# Patient Record
Sex: Female | Born: 1955 | Hispanic: No | Marital: Married | Smoking: Former smoker
Health system: Southern US, Community
[De-identification: ages and names within clinical notes are randomized; demographics above are authoritative.]

---

## 2008-09-29 ENCOUNTER — Ambulatory Visit: Admission: RE | Admit: 2008-09-29 | Discharge: 2008-12-24 | Payer: Self-pay | Admitting: Radiation Oncology

## 2008-09-30 ENCOUNTER — Ambulatory Visit: Payer: Self-pay | Admitting: Oncology

## 2008-10-01 LAB — COMPREHENSIVE METABOLIC PANEL
BUN: 14 mg/dL (ref 6–23)
CO2: 25 mEq/L (ref 19–32)
Calcium: 9.6 mg/dL (ref 8.4–10.5)
Chloride: 105 mEq/L (ref 96–112)
Creatinine, Ser: 0.67 mg/dL (ref 0.40–1.20)

## 2008-10-01 LAB — CBC WITH DIFFERENTIAL/PLATELET
Basophils Absolute: 0.1 10*3/uL (ref 0.0–0.1)
EOS%: 1.8 % (ref 0.0–7.0)
HCT: 31.5 % — ABNORMAL LOW (ref 34.8–46.6)
HGB: 10.4 g/dL — ABNORMAL LOW (ref 11.6–15.9)
LYMPH%: 18.7 % (ref 14.0–48.0)
MCH: 28.8 pg (ref 26.0–34.0)
MONO#: 0.6 10*3/uL (ref 0.1–0.9)
NEUT%: 73.5 % (ref 39.6–76.8)
Platelets: 518 10*3/uL — ABNORMAL HIGH (ref 145–400)
lymph#: 2.2 10*3/uL (ref 0.9–3.3)

## 2008-10-15 LAB — CBC WITH DIFFERENTIAL/PLATELET
BASO%: 0.5 % (ref 0.0–2.0)
Basophils Absolute: 0.1 10*3/uL (ref 0.0–0.1)
Eosinophils Absolute: 0.2 10*3/uL (ref 0.0–0.5)
HCT: 34.9 % (ref 34.8–46.6)
HGB: 11.5 g/dL — ABNORMAL LOW (ref 11.6–15.9)
MONO#: 0.4 10*3/uL (ref 0.1–0.9)
NEUT#: 9.7 10*3/uL — ABNORMAL HIGH (ref 1.5–6.5)
NEUT%: 75.3 % (ref 39.6–76.8)
WBC: 12.9 10*3/uL — ABNORMAL HIGH (ref 3.9–10.0)
lymph#: 2.5 10*3/uL (ref 0.9–3.3)

## 2008-10-15 LAB — COMPREHENSIVE METABOLIC PANEL
ALT: 12 U/L (ref 0–35)
AST: 14 U/L (ref 0–37)
CO2: 25 mEq/L (ref 19–32)
Sodium: 138 mEq/L (ref 135–145)
Total Bilirubin: 0.2 mg/dL — ABNORMAL LOW (ref 0.3–1.2)
Total Protein: 7.6 g/dL (ref 6.0–8.3)

## 2008-11-03 LAB — CBC WITH DIFFERENTIAL/PLATELET
BASO%: 0.4 % (ref 0.0–2.0)
Eosinophils Absolute: 0.2 10*3/uL (ref 0.0–0.5)
LYMPH%: 21 % (ref 14.0–48.0)
MCHC: 32.9 g/dL (ref 32.0–36.0)
MONO#: 0.6 10*3/uL (ref 0.1–0.9)
NEUT#: 8.4 10*3/uL — ABNORMAL HIGH (ref 1.5–6.5)
Platelets: 354 10*3/uL (ref 145–400)
RBC: 4.21 10*6/uL (ref 3.70–5.32)
WBC: 11.7 10*3/uL — ABNORMAL HIGH (ref 3.9–10.0)
lymph#: 2.5 10*3/uL (ref 0.9–3.3)

## 2008-11-10 LAB — COMPREHENSIVE METABOLIC PANEL
ALT: 12 U/L (ref 0–35)
BUN: 18 mg/dL (ref 6–23)
CO2: 24 mEq/L (ref 19–32)
Calcium: 9.4 mg/dL (ref 8.4–10.5)
Chloride: 96 mEq/L (ref 96–112)
Creatinine, Ser: 1.21 mg/dL — ABNORMAL HIGH (ref 0.40–1.20)
Glucose, Bld: 94 mg/dL (ref 70–99)
Total Bilirubin: 0.2 mg/dL — ABNORMAL LOW (ref 0.3–1.2)

## 2008-11-10 LAB — CBC WITH DIFFERENTIAL/PLATELET
BASO%: 0.5 % (ref 0.0–2.0)
Basophils Absolute: 0 10*3/uL (ref 0.0–0.1)
Eosinophils Absolute: 0 10*3/uL (ref 0.0–0.5)
HCT: 35.3 % (ref 34.8–46.6)
HGB: 11.6 g/dL (ref 11.6–15.9)
LYMPH%: 20.4 % (ref 14.0–49.7)
MONO#: 0.5 10*3/uL (ref 0.1–0.9)
NEUT#: 6.2 10*3/uL (ref 1.5–6.5)
NEUT%: 72.3 % (ref 38.4–76.8)
Platelets: 371 10*3/uL (ref 145–400)
WBC: 8.6 10*3/uL (ref 3.9–10.3)
lymph#: 1.8 10*3/uL (ref 0.9–3.3)

## 2008-11-13 LAB — BASIC METABOLIC PANEL
CO2: 22 mEq/L (ref 19–32)
Calcium: 8.4 mg/dL (ref 8.4–10.5)
Potassium: 4.1 mEq/L (ref 3.5–5.3)
Sodium: 140 mEq/L (ref 135–145)

## 2008-11-17 ENCOUNTER — Ambulatory Visit: Payer: Self-pay | Admitting: Oncology

## 2008-11-17 LAB — COMPREHENSIVE METABOLIC PANEL
ALT: <8 U/L (ref 0–35)
Alkaline Phosphatase: 79 U/L (ref 39–117)
CO2: 19 mEq/L (ref 19–32)
Sodium: 141 mEq/L (ref 135–145)
Total Bilirubin: 0.3 mg/dL (ref 0.3–1.2)
Total Protein: 7.7 g/dL (ref 6.0–8.3)

## 2008-11-17 LAB — CBC WITH DIFFERENTIAL/PLATELET
BASO%: 0.3 % (ref 0.0–2.0)
LYMPH%: 9.8 % — ABNORMAL LOW (ref 14.0–49.7)
MCHC: 32.7 g/dL (ref 31.5–36.0)
MONO#: 0.4 10*3/uL (ref 0.1–0.9)
Platelets: 296 10*3/uL (ref 145–400)
RBC: 4.21 10*6/uL (ref 3.70–5.45)
RDW: 16.6 % — ABNORMAL HIGH (ref 11.2–14.5)
WBC: 8.3 10*3/uL (ref 3.9–10.3)

## 2008-11-24 LAB — COMPREHENSIVE METABOLIC PANEL
Alkaline Phosphatase: 94 U/L (ref 39–117)
BUN: 27 mg/dL — ABNORMAL HIGH (ref 6–23)
Glucose, Bld: 110 mg/dL — ABNORMAL HIGH (ref 70–99)
Sodium: 136 mEq/L (ref 135–145)
Total Bilirubin: 0.3 mg/dL (ref 0.3–1.2)

## 2008-11-24 LAB — CBC WITH DIFFERENTIAL/PLATELET
Basophils Absolute: 0 10*3/uL (ref 0.0–0.1)
Eosinophils Absolute: 0.1 10*3/uL (ref 0.0–0.5)
HCT: 38 % (ref 34.8–46.6)
HGB: 12.2 g/dL (ref 11.6–15.9)
LYMPH%: 14.7 % (ref 14.0–49.7)
MCV: 84.3 fL (ref 79.5–101.0)
MONO%: 8.2 % (ref 0.0–14.0)
NEUT#: 4.1 10*3/uL (ref 1.5–6.5)
Platelets: 435 10*3/uL — ABNORMAL HIGH (ref 145–400)

## 2008-12-01 LAB — CBC WITH DIFFERENTIAL/PLATELET
BASO%: 0.1 % (ref 0.0–2.0)
HCT: 36.8 % (ref 34.8–46.6)
LYMPH%: 9.4 % — ABNORMAL LOW (ref 14.0–49.7)
MCH: 28.1 pg (ref 25.1–34.0)
MCHC: 33.4 g/dL (ref 31.5–36.0)
MONO#: 0.3 10*3/uL (ref 0.1–0.9)
NEUT%: 86.3 % — ABNORMAL HIGH (ref 38.4–76.8)
Platelets: 305 10*3/uL (ref 145–400)
WBC: 7.9 10*3/uL (ref 3.9–10.3)

## 2008-12-01 LAB — BASIC METABOLIC PANEL
BUN: 35 mg/dL — ABNORMAL HIGH (ref 6–23)
CO2: 25 mEq/L (ref 19–32)
Calcium: 9.9 mg/dL (ref 8.4–10.5)
Creatinine, Ser: 0.92 mg/dL (ref 0.40–1.20)

## 2008-12-02 ENCOUNTER — Ambulatory Visit (HOSPITAL_COMMUNITY): Admission: RE | Admit: 2008-12-02 | Discharge: 2008-12-02 | Payer: Self-pay | Admitting: Radiation Oncology

## 2008-12-08 LAB — CBC WITH DIFFERENTIAL/PLATELET
BASO%: 0.5 % (ref 0.0–2.0)
Basophils Absolute: 0 10*3/uL (ref 0.0–0.1)
EOS%: 0.5 % (ref 0.0–7.0)
HGB: 11.4 g/dL — ABNORMAL LOW (ref 11.6–15.9)
MCH: 26.9 pg (ref 25.1–34.0)
MCHC: 31.8 g/dL (ref 31.5–36.0)
RBC: 4.24 10*6/uL (ref 3.70–5.45)
RDW: 15.8 % — ABNORMAL HIGH (ref 11.2–14.5)
lymph#: 0.6 10*3/uL — ABNORMAL LOW (ref 0.9–3.3)
nRBC: 0 % (ref 0–0)

## 2008-12-08 LAB — COMPREHENSIVE METABOLIC PANEL
ALT: 17 U/L (ref 0–35)
AST: 24 U/L (ref 0–37)
Albumin: 3.7 g/dL (ref 3.5–5.2)
Alkaline Phosphatase: 109 U/L (ref 39–117)
Potassium: 4.7 mEq/L (ref 3.5–5.3)
Sodium: 136 mEq/L (ref 135–145)
Total Bilirubin: 0.5 mg/dL (ref 0.3–1.2)
Total Protein: 7.4 g/dL (ref 6.0–8.3)

## 2008-12-16 LAB — CBC WITH DIFFERENTIAL/PLATELET
EOS%: 0.4 % (ref 0.0–7.0)
LYMPH%: 7.9 % — ABNORMAL LOW (ref 14.0–49.7)
MCH: 28.3 pg (ref 25.1–34.0)
MCV: 83.6 fL (ref 79.5–101.0)
MONO%: 7 % (ref 0.0–14.0)
Platelets: 367 10*3/uL (ref 145–400)
RBC: 3.74 10*6/uL (ref 3.70–5.45)
RDW: 17 % — ABNORMAL HIGH (ref 11.2–14.5)

## 2008-12-16 LAB — BASIC METABOLIC PANEL
BUN: 26 mg/dL — ABNORMAL HIGH (ref 6–23)
Calcium: 9.2 mg/dL (ref 8.4–10.5)
Glucose, Bld: 123 mg/dL — ABNORMAL HIGH (ref 70–99)
Potassium: 4.8 mEq/L (ref 3.5–5.3)
Sodium: 132 mEq/L — ABNORMAL LOW (ref 135–145)

## 2009-01-13 ENCOUNTER — Ambulatory Visit: Payer: Self-pay | Admitting: Oncology

## 2009-01-15 LAB — COMPREHENSIVE METABOLIC PANEL
ALT: <8 U/L (ref 0–35)
Albumin: 4.2 g/dL (ref 3.5–5.2)
CO2: 24 mEq/L (ref 19–32)
Calcium: 9.3 mg/dL (ref 8.4–10.5)
Chloride: 101 mEq/L (ref 96–112)
Potassium: 4.7 mEq/L (ref 3.5–5.3)
Sodium: 145 mEq/L (ref 135–145)
Total Bilirubin: 0.2 mg/dL — ABNORMAL LOW (ref 0.3–1.2)
Total Protein: 7.2 g/dL (ref 6.0–8.3)

## 2009-01-15 LAB — CBC WITH DIFFERENTIAL/PLATELET
BASO%: 0.4 % (ref 0.0–2.0)
Eosinophils Absolute: 0.1 10*3/uL (ref 0.0–0.5)
MCHC: 32.3 g/dL (ref 31.5–36.0)
MONO#: 0.5 10*3/uL (ref 0.1–0.9)
NEUT#: 4 10*3/uL (ref 1.5–6.5)
RBC: 3.75 10*6/uL (ref 3.70–5.45)
WBC: 5.4 10*3/uL (ref 3.9–10.3)
lymph#: 0.8 10*3/uL — ABNORMAL LOW (ref 0.9–3.3)

## 2009-03-11 ENCOUNTER — Ambulatory Visit: Payer: Self-pay | Admitting: Oncology

## 2009-03-13 LAB — COMPREHENSIVE METABOLIC PANEL
ALT: <8 U/L (ref 0–35)
CO2: 24 mEq/L (ref 19–32)
Calcium: 9.3 mg/dL (ref 8.4–10.5)
Chloride: 107 mEq/L (ref 96–112)
Creatinine, Ser: 0.84 mg/dL (ref 0.40–1.20)
Glucose, Bld: 135 mg/dL — ABNORMAL HIGH (ref 70–99)
Sodium: 142 mEq/L (ref 135–145)
Total Protein: 7.2 g/dL (ref 6.0–8.3)

## 2009-03-13 LAB — CBC WITH DIFFERENTIAL/PLATELET
BASO%: 0.4 % (ref 0.0–2.0)
Eosinophils Absolute: 0.1 10*3/uL (ref 0.0–0.5)
HCT: 29 % — ABNORMAL LOW (ref 34.8–46.6)
LYMPH%: 12.9 % — ABNORMAL LOW (ref 14.0–49.7)
MCHC: 34.6 g/dL (ref 31.5–36.0)
MONO#: 0.3 10*3/uL (ref 0.1–0.9)
NEUT#: 3.5 10*3/uL (ref 1.5–6.5)
NEUT%: 77.1 % — ABNORMAL HIGH (ref 38.4–76.8)
Platelets: 287 10*3/uL (ref 145–400)
WBC: 4.5 10*3/uL (ref 3.9–10.3)
lymph#: 0.6 10*3/uL — ABNORMAL LOW (ref 0.9–3.3)

## 2009-11-17 ENCOUNTER — Ambulatory Visit: Admission: RE | Admit: 2009-11-17 | Discharge: 2010-01-05 | Payer: Self-pay | Admitting: Radiation Oncology

## 2010-10-10 ENCOUNTER — Encounter: Payer: Self-pay | Admitting: Radiation Oncology

## 2015-08-19 ENCOUNTER — Other Ambulatory Visit: Payer: Self-pay | Admitting: Family

## 2016-02-19 ENCOUNTER — Other Ambulatory Visit: Payer: Self-pay | Admitting: Internal Medicine

## 2016-02-19 DIAGNOSIS — E2839 Other primary ovarian failure: Secondary | ICD-10-CM

## 2016-03-16 ENCOUNTER — Ambulatory Visit
Admission: RE | Admit: 2016-03-16 | Discharge: 2016-03-16 | Disposition: A | Discharge status: Home / Self Care | Payer: Medicare Other | Source: Ambulatory Visit | Attending: Internal Medicine | Admitting: Internal Medicine

## 2016-03-16 DIAGNOSIS — E2839 Other primary ovarian failure: Secondary | ICD-10-CM

## 2018-09-21 ENCOUNTER — Other Ambulatory Visit: Payer: Self-pay | Admitting: Internal Medicine

## 2018-09-21 DIAGNOSIS — E2839 Other primary ovarian failure: Secondary | ICD-10-CM

## 2018-12-04 ENCOUNTER — Other Ambulatory Visit: Payer: Medicare Other

## 2019-02-06 ENCOUNTER — Other Ambulatory Visit: Payer: Medicare Other

## 2019-10-05 ENCOUNTER — Emergency Department (HOSPITAL_COMMUNITY): Payer: Medicare Other

## 2019-10-05 ENCOUNTER — Inpatient Hospital Stay (HOSPITAL_COMMUNITY)
Admission: EM | Admit: 2019-10-05 | Discharge: 2019-10-11 | DRG: 175 | Disposition: A | Discharge status: Hospice | Payer: Medicare Other | Attending: Internal Medicine | Admitting: Internal Medicine

## 2019-10-05 ENCOUNTER — Other Ambulatory Visit: Payer: Self-pay

## 2019-10-05 DIAGNOSIS — G9341 Metabolic encephalopathy: Secondary | ICD-10-CM

## 2019-10-05 DIAGNOSIS — K922 Gastrointestinal hemorrhage, unspecified: Secondary | ICD-10-CM | POA: Diagnosis present

## 2019-10-05 DIAGNOSIS — R599 Enlarged lymph nodes, unspecified: Secondary | ICD-10-CM | POA: Diagnosis present

## 2019-10-05 DIAGNOSIS — C799 Secondary malignant neoplasm of unspecified site: Secondary | ICD-10-CM

## 2019-10-05 DIAGNOSIS — R4781 Slurred speech: Secondary | ICD-10-CM | POA: Diagnosis present

## 2019-10-05 DIAGNOSIS — G92 Toxic encephalopathy: Secondary | ICD-10-CM | POA: Diagnosis present

## 2019-10-05 DIAGNOSIS — D649 Anemia, unspecified: Secondary | ICD-10-CM

## 2019-10-05 DIAGNOSIS — R5381 Other malaise: Secondary | ICD-10-CM | POA: Diagnosis not present

## 2019-10-05 DIAGNOSIS — Z7951 Long term (current) use of inhaled steroids: Secondary | ICD-10-CM

## 2019-10-05 DIAGNOSIS — H905 Unspecified sensorineural hearing loss: Secondary | ICD-10-CM | POA: Diagnosis present

## 2019-10-05 DIAGNOSIS — Z7401 Bed confinement status: Secondary | ICD-10-CM

## 2019-10-05 DIAGNOSIS — I10 Essential (primary) hypertension: Secondary | ICD-10-CM | POA: Diagnosis present

## 2019-10-05 DIAGNOSIS — J439 Emphysema, unspecified: Secondary | ICD-10-CM | POA: Diagnosis present

## 2019-10-05 DIAGNOSIS — D509 Iron deficiency anemia, unspecified: Secondary | ICD-10-CM | POA: Diagnosis present

## 2019-10-05 DIAGNOSIS — R531 Weakness: Secondary | ICD-10-CM | POA: Diagnosis not present

## 2019-10-05 DIAGNOSIS — J449 Chronic obstructive pulmonary disease, unspecified: Secondary | ICD-10-CM | POA: Diagnosis not present

## 2019-10-05 DIAGNOSIS — Z993 Dependence on wheelchair: Secondary | ICD-10-CM

## 2019-10-05 DIAGNOSIS — I2694 Multiple subsegmental pulmonary emboli without acute cor pulmonale: Secondary | ICD-10-CM | POA: Diagnosis present

## 2019-10-05 DIAGNOSIS — C7889 Secondary malignant neoplasm of other digestive organs: Secondary | ICD-10-CM | POA: Diagnosis present

## 2019-10-05 DIAGNOSIS — K639 Disease of intestine, unspecified: Secondary | ICD-10-CM | POA: Diagnosis present

## 2019-10-05 DIAGNOSIS — N179 Acute kidney failure, unspecified: Secondary | ICD-10-CM | POA: Diagnosis present

## 2019-10-05 DIAGNOSIS — J9621 Acute and chronic respiratory failure with hypoxia: Secondary | ICD-10-CM | POA: Diagnosis present

## 2019-10-05 DIAGNOSIS — Z515 Encounter for palliative care: Secondary | ICD-10-CM | POA: Diagnosis not present

## 2019-10-05 DIAGNOSIS — Z79899 Other long term (current) drug therapy: Secondary | ICD-10-CM

## 2019-10-05 DIAGNOSIS — Z7189 Other specified counseling: Secondary | ICD-10-CM | POA: Diagnosis not present

## 2019-10-05 DIAGNOSIS — Z7984 Long term (current) use of oral hypoglycemic drugs: Secondary | ICD-10-CM

## 2019-10-05 DIAGNOSIS — F329 Major depressive disorder, single episode, unspecified: Secondary | ICD-10-CM | POA: Diagnosis present

## 2019-10-05 DIAGNOSIS — E890 Postprocedural hypothyroidism: Secondary | ICD-10-CM | POA: Diagnosis present

## 2019-10-05 DIAGNOSIS — I2699 Other pulmonary embolism without acute cor pulmonale: Secondary | ICD-10-CM

## 2019-10-05 DIAGNOSIS — Z9981 Dependence on supplemental oxygen: Secondary | ICD-10-CM | POA: Diagnosis not present

## 2019-10-05 DIAGNOSIS — E1165 Type 2 diabetes mellitus with hyperglycemia: Secondary | ICD-10-CM | POA: Diagnosis present

## 2019-10-05 DIAGNOSIS — F419 Anxiety disorder, unspecified: Secondary | ICD-10-CM | POA: Diagnosis present

## 2019-10-05 DIAGNOSIS — E872 Acidosis: Secondary | ICD-10-CM | POA: Diagnosis present

## 2019-10-05 DIAGNOSIS — C7970 Secondary malignant neoplasm of unspecified adrenal gland: Secondary | ICD-10-CM | POA: Diagnosis present

## 2019-10-05 DIAGNOSIS — Z20822 Contact with and (suspected) exposure to covid-19: Secondary | ICD-10-CM | POA: Diagnosis present

## 2019-10-05 DIAGNOSIS — Z85850 Personal history of malignant neoplasm of thyroid: Secondary | ICD-10-CM

## 2019-10-05 DIAGNOSIS — Z853 Personal history of malignant neoplasm of breast: Secondary | ICD-10-CM

## 2019-10-05 DIAGNOSIS — Z87891 Personal history of nicotine dependence: Secondary | ICD-10-CM

## 2019-10-05 DIAGNOSIS — Z66 Do not resuscitate: Secondary | ICD-10-CM | POA: Diagnosis not present

## 2019-10-05 DIAGNOSIS — K6389 Other specified diseases of intestine: Secondary | ICD-10-CM | POA: Diagnosis not present

## 2019-10-05 DIAGNOSIS — Z7901 Long term (current) use of anticoagulants: Secondary | ICD-10-CM

## 2019-10-05 DIAGNOSIS — R569 Unspecified convulsions: Secondary | ICD-10-CM | POA: Diagnosis not present

## 2019-10-05 DIAGNOSIS — Z8589 Personal history of malignant neoplasm of other organs and systems: Secondary | ICD-10-CM

## 2019-10-05 DIAGNOSIS — Z79890 Hormone replacement therapy (postmenopausal): Secondary | ICD-10-CM

## 2019-10-05 DIAGNOSIS — Z86711 Personal history of pulmonary embolism: Secondary | ICD-10-CM

## 2019-10-05 DIAGNOSIS — R410 Disorientation, unspecified: Secondary | ICD-10-CM | POA: Diagnosis not present

## 2019-10-05 LAB — RAPID URINE DRUG SCREEN, HOSP PERFORMED
Amphetamines: NOT DETECTED
Barbiturates: NOT DETECTED
Benzodiazepines: POSITIVE — AB
Cocaine: NOT DETECTED
Opiates: NOT DETECTED
Tetrahydrocannabinol: NOT DETECTED

## 2019-10-05 LAB — URINALYSIS, ROUTINE W REFLEX MICROSCOPIC
Bilirubin Urine: NEGATIVE
Glucose, UA: NEGATIVE mg/dL
Hgb urine dipstick: NEGATIVE
Ketones, ur: NEGATIVE mg/dL
Nitrite: NEGATIVE
Protein, ur: 30 mg/dL — AB
Specific Gravity, Urine: 1.021 (ref 1.005–1.030)
pH: 5 (ref 5.0–8.0)

## 2019-10-05 LAB — BASIC METABOLIC PANEL
Anion gap: 14 (ref 5–15)
BUN: 13 mg/dL (ref 8–23)
CO2: 22 mmol/L (ref 22–32)
Calcium: 8.9 mg/dL (ref 8.9–10.3)
Chloride: 108 mmol/L (ref 98–111)
Creatinine, Ser: 1.22 mg/dL — ABNORMAL HIGH (ref 0.44–1.00)
GFR calc Af Amer: 54 mL/min — ABNORMAL LOW (ref >60)
GFR calc non Af Amer: 47 mL/min — ABNORMAL LOW (ref >60)
Glucose, Bld: 130 mg/dL — ABNORMAL HIGH (ref 70–99)
Potassium: 3.8 mmol/L (ref 3.5–5.1)
Sodium: 144 mmol/L (ref 135–145)

## 2019-10-05 LAB — FOLATE: Folate: 36.8 ng/mL (ref >5.9)

## 2019-10-05 LAB — RETICULOCYTES
Immature Retic Fract: 28.9 % — ABNORMAL HIGH (ref 2.3–15.9)
RBC.: 3.51 MIL/uL — ABNORMAL LOW (ref 3.87–5.11)
Retic Count, Absolute: 80 10*3/uL (ref 19.0–186.0)
Retic Ct Pct: 2.3 % (ref 0.4–3.1)

## 2019-10-05 LAB — ABO/RH: ABO/RH(D): O POS

## 2019-10-05 LAB — CBC
HCT: 27.2 % — ABNORMAL LOW (ref 36.0–46.0)
Hemoglobin: 7.1 g/dL — ABNORMAL LOW (ref 12.0–15.0)
MCH: 20.1 pg — ABNORMAL LOW (ref 26.0–34.0)
MCHC: 26.1 g/dL — ABNORMAL LOW (ref 30.0–36.0)
MCV: 76.8 fL — ABNORMAL LOW (ref 80.0–100.0)
Platelets: 493 10*3/uL — ABNORMAL HIGH (ref 150–400)
RBC: 3.54 MIL/uL — ABNORMAL LOW (ref 3.87–5.11)
RDW: 22.9 % — ABNORMAL HIGH (ref 11.5–15.5)
WBC: 14.5 10*3/uL — ABNORMAL HIGH (ref 4.0–10.5)
nRBC: 0 % (ref 0.0–0.2)

## 2019-10-05 LAB — IRON AND TIBC
Iron: 13 ug/dL — ABNORMAL LOW (ref 28–170)
Saturation Ratios: 3 % — ABNORMAL LOW (ref 10.4–31.8)
TIBC: 463 ug/dL — ABNORMAL HIGH (ref 250–450)
UIBC: 450 ug/dL

## 2019-10-05 LAB — D-DIMER, QUANTITATIVE: D-Dimer, Quant: 3.91 ug/mL-FEU — ABNORMAL HIGH (ref 0.00–0.50)

## 2019-10-05 LAB — PROTIME-INR
INR: 1.2 (ref 0.8–1.2)
Prothrombin Time: 15.3 seconds — ABNORMAL HIGH (ref 11.4–15.2)

## 2019-10-05 LAB — BLOOD GAS, ARTERIAL
Acid-base deficit: 0.7 mmol/L (ref 0.0–2.0)
Bicarbonate: 23.3 mmol/L (ref 20.0–28.0)
Drawn by: 33147
FIO2: 100
O2 Content: 15 L/min
O2 Saturation: 99.4 %
Patient temperature: 98.6
pCO2 arterial: 38.1 mmHg (ref 32.0–48.0)
pH, Arterial: 7.405 (ref 7.350–7.450)
pO2, Arterial: 299 mmHg — ABNORMAL HIGH (ref 83.0–108.0)

## 2019-10-05 LAB — AMMONIA: Ammonia: 40 umol/L — ABNORMAL HIGH (ref 9–35)

## 2019-10-05 LAB — FERRITIN: Ferritin: 7 ng/mL — ABNORMAL LOW (ref 11–307)

## 2019-10-05 LAB — PREPARE RBC (CROSSMATCH)

## 2019-10-05 LAB — POC OCCULT BLOOD, ED: Fecal Occult Bld: POSITIVE — AB

## 2019-10-05 LAB — CBG MONITORING, ED: Glucose-Capillary: 136 mg/dL — ABNORMAL HIGH (ref 70–99)

## 2019-10-05 LAB — TROPONIN I (HIGH SENSITIVITY): Troponin I (High Sensitivity): 3 ng/L (ref <18)

## 2019-10-05 LAB — LACTIC ACID, PLASMA
Lactic Acid, Venous: 4.5 mmol/L (ref 0.5–1.9)
Lactic Acid, Venous: 1.8 mmol/L (ref 0.5–1.9)

## 2019-10-05 LAB — POC SARS CORONAVIRUS 2 AG -  ED: SARS Coronavirus 2 Ag: NEGATIVE

## 2019-10-05 LAB — APTT: aPTT: 30 seconds (ref 24–36)

## 2019-10-05 LAB — VITAMIN B12: Vitamin B-12: 813 pg/mL (ref 180–914)

## 2019-10-05 LAB — BRAIN NATRIURETIC PEPTIDE: B Natriuretic Peptide: 73.9 pg/mL (ref 0.0–100.0)

## 2019-10-05 MED ORDER — SODIUM CHLORIDE 0.9 % IV SOLN: 1.0000 g | Freq: Once | INTRAVENOUS | Status: DC

## 2019-10-05 MED ORDER — VANCOMYCIN HCL IN DEXTROSE 1-5 GM/200ML-% IV SOLN
1000.0000 mg | Freq: Once | INTRAVENOUS | Status: AC
  Administered 2019-10-05: 1000 mg via INTRAVENOUS
  Filled 2019-10-05: qty 200

## 2019-10-05 MED ORDER — SODIUM CHLORIDE 0.9 % IV BOLUS
1000.0000 mL | Freq: Once | INTRAVENOUS | Status: AC
  Administered 2019-10-05: 1000 mL via INTRAVENOUS

## 2019-10-05 MED ORDER — ONDANSETRON HCL 4 MG/2ML IJ SOLN
INTRAMUSCULAR | Status: AC
  Filled 2019-10-05: qty 2

## 2019-10-05 MED ORDER — IOHEXOL 350 MG/ML SOLN
100.0000 mL | Freq: Once | INTRAVENOUS | Status: AC | PRN
  Administered 2019-10-05: 100 mL via INTRAVENOUS

## 2019-10-05 MED ORDER — ONDANSETRON HCL 4 MG/2ML IJ SOLN
4.0000 mg | Freq: Once | INTRAMUSCULAR | Status: AC
  Administered 2019-10-05: 4 mg via INTRAVENOUS

## 2019-10-05 MED ORDER — SODIUM CHLORIDE 0.9 % IV SOLN
2.0000 g | Freq: Once | INTRAVENOUS | Status: AC
  Administered 2019-10-05: 2 g via INTRAVENOUS
  Filled 2019-10-05: qty 2

## 2019-10-05 MED ORDER — SODIUM CHLORIDE 0.9% FLUSH: 10.0000 mL | INTRAVENOUS | Status: DC | PRN

## 2019-10-05 MED ORDER — SODIUM CHLORIDE (PF) 0.9 % IJ SOLN
INTRAMUSCULAR | Status: AC
  Filled 2019-10-05: qty 50

## 2019-10-05 MED ORDER — PANTOPRAZOLE SODIUM 40 MG IV SOLR
40.0000 mg | Freq: Two times a day (BID) | INTRAVENOUS | Status: DC
  Administered 2019-10-05 – 2019-10-10 (×9): 40 mg via INTRAVENOUS
  Filled 2019-10-05 (×9): qty 40

## 2019-10-05 MED ORDER — SODIUM CHLORIDE 0.9 % IV SOLN: 10.0000 mL/h | Freq: Once | INTRAVENOUS | Status: DC

## 2019-10-05 MED ORDER — SODIUM CHLORIDE 0.9% FLUSH
10.0000 mL | Freq: Two times a day (BID) | INTRAVENOUS | Status: DC
  Administered 2019-10-06 – 2019-10-10 (×5): 10 mL

## 2019-10-05 MED ORDER — HEPARIN (PORCINE) 25000 UT/250ML-% IV SOLN
1300.0000 [IU]/h | INTRAVENOUS | Status: DC
  Administered 2019-10-05: 1200 [IU]/h via INTRAVENOUS
  Administered 2019-10-06 – 2019-10-09 (×3): 1300 [IU]/h via INTRAVENOUS
  Filled 2019-10-05 (×5): qty 250

## 2019-10-05 NOTE — ED Provider Notes (Signed)
[facility] DEPT Provider Note   CSN: [id number] Arrival date & time: 10/05/19  1351     History Chief Complaint  Patient presents with  . Cancer Patient  . Shortness of Breath    Loretta Brooks is a 64 y.o. female.  HPI   Patient arrived to the emergency room via EMS.  According to her husband who I spoke to directly, patient has not been acting normally the last couple of days.  She has been not making a lot of sense.  She has been confused.  She also has been weak and not eating or drinking as well.  The EMS reports that when they went to check on her today her oxygen saturation was very low.  Husband has not noticed the patient to be coughing.  She has not had any fevers.  No vomiting or diarrhea.  Patient does have a history of breast CA.  She also does have history of hearing loss.  Husband states that hearing loss is a new issue. Past medical history: Hearing loss, breast cancer Surgical history: Left maxillectomy Patient Active Problem List   Diagnosis Date Noted  . Acute on chronic respiratory failure with hypoxia ([place]) 10/05/2019    History reviewed. No pertinent surgical history.   OB History   No obstetric history on file.     No family history on file.  Social History   Tobacco Use  . Smoking status: Not on file  Substance Use Topics  . Alcohol use: Not on file  . Drug use: Not on file    Home Medications Prior to Admission medications   Not on File    Allergies    Patient has no allergy information on record.  Review of Systems   Review of Systems  All other systems reviewed and are negative.   Physical Exam Updated Vital Signs BP (!) 153/92 (BP Location: Right Arm)   Pulse 94   Temp 98.6 F (37 C) (Oral)   Resp (!) 22   Ht 1.702 m (5\' 7" )   Wt 91.9 kg   SpO2 100%   BMI 31.72 kg/m   Physical Exam Vitals and nursing note reviewed.  Constitutional:      General: She is not in acute distress.  Appearance: She is well-developed.  HENT:     Head: Normocephalic and atraumatic.     Right Ear: External ear normal.     Left Ear: External ear normal.  Eyes:     General: No scleral icterus.       Right eye: No discharge.        Left eye: No discharge.     Conjunctiva/sclera: Conjunctivae normal.  Neck:     Trachea: No tracheal deviation.  Cardiovascular:     Rate and Rhythm: Normal rate and regular rhythm.  Pulmonary:     Effort: Pulmonary effort is normal. No respiratory distress.     Breath sounds: Normal breath sounds. No stridor. No wheezing or rales.  Abdominal:     General: Bowel sounds are normal. There is no distension.     Palpations: Abdomen is soft.     Tenderness: There is no abdominal tenderness. There is no guarding or rebound.  Musculoskeletal:        General: No tenderness.     Cervical back: Neck supple.  Skin:    General: Skin is warm and dry.     Findings: No rash.  Neurological:     Mental Status: She  is alert.     Cranial Nerves: No cranial nerve deficit (no facial droop, extraocular movements intact, no slurred speech).     Sensory: No sensory deficit.     Motor: No abnormal muscle tone or seizure activity.     Coordination: Coordination normal.     ED Results / Procedures / Treatments   Labs (all labs ordered are listed, but only abnormal results are displayed) Labs Reviewed  CBG MONITORING, ED - Abnormal; Notable for the following components:      Result Value   Glucose-Capillary 136 (*)    All other components within normal limits  CULTURE, BLOOD (ROUTINE X 2)  CULTURE, BLOOD (ROUTINE X 2)  BASIC METABOLIC PANEL  CBC  D-DIMER, QUANTITATIVE (NOT AT [facility])  BRAIN NATRIURETIC PEPTIDE  URINALYSIS, ROUTINE W REFLEX MICROSCOPIC  LACTIC ACID, PLASMA  I-STAT ARTERIAL BLOOD GAS, ED  POC SARS CORONAVIRUS 2 AG -  ED  TROPONIN I (HIGH SENSITIVITY)    EKG None  Radiology CT Head Wo Contrast  Result Date: 10/05/2019 CLINICAL DATA:  Altered  mental status. EXAM: CT HEAD WITHOUT CONTRAST TECHNIQUE: Contiguous axial images were obtained from the base of the skull through the vertex without intravenous contrast. COMPARISON:  Feb 01, 2009 FINDINGS: Brain: No evidence of acute infarction, hemorrhage, hydrocephalus, extra-axial collection or mass lesion/mass effect. 8 mm area of hypoattenuation in the subcortical/periventricular right frontal lobe is stable. Vascular: No hyperdense vessel or unexpected calcification. Skull: No evidence of fracture. Postsurgical and post reconstruction changes from left maxillary resection. Sinuses/Orbits: The left sphenoid sinus and left mastoid air cells are opacified. Other: None. IMPRESSION: 1. No acute intracranial abnormality. 2. Chronic 8 mm area of hypoattenuation in the subcortical/periventricular right frontal lobe. 3. Opacification of the left sphenoid sinus and left mastoid air cells. Electronically Signed   By: Fidela Salisbury M.D.   On: 10/05/2019 15:31   CT Angio Chest PE W and/or Wo Contrast  Result Date: 10/05/2019 CLINICAL DATA:  64 year old female with history of shortness of breath. Elevated D-dimer. EXAM: CT ANGIOGRAPHY CHEST WITH CONTRAST TECHNIQUE: Multidetector CT imaging of the chest was performed using the standard protocol during bolus administration of intravenous contrast. Multiplanar CT image reconstructions and MIPs were obtained to evaluate the vascular anatomy. CONTRAST:  187mL OMNIPAQUE IOHEXOL 350 MG/ML SOLN COMPARISON:  No priors. FINDINGS: Cardiovascular: Several nonocclusive filling defects are noted within segmental and subsegmental sized pulmonary artery branches in the left lung, most evident in the left upper lobe, compatible with pulmonary embolism. Heart size is mildly enlarged. There is no significant pericardial fluid, thickening or pericardial calcification. There is aortic atherosclerosis, as well as atherosclerosis of the great vessels of the mediastinum and the coronary  arteries, including calcified atherosclerotic plaque in the left main, left anterior descending, left circumflex and right coronary arteries. Mediastinum/Nodes: No pathologically enlarged mediastinal or hilar lymph nodes. Esophagus is unremarkable in appearance. No axillary lymphadenopathy. Lungs/Pleura: Linear areas of scarring and/or atelectasis throughout the mid to lower lungs bilaterally. No acute consolidative airspace disease. No pleural effusions. Upper Abdomen: 2.0 x 1.5 cm right adrenal nodule incompletely characterized. Aortic atherosclerosis. Musculoskeletal: There are no aggressive appearing lytic or blastic lesions noted in the visualized portions of the skeleton. Review of the MIP images confirms the above findings. IMPRESSION: 1. Multiple small nonocclusive segmental and subsegmental sized pulmonary emboli in the left lung. 2. Aortic atherosclerosis, in addition to left main and 3 vessel coronary artery disease. Please note that although the presence of coronary artery calcium  documents the presence of coronary artery disease, the severity of this disease and any potential stenosis cannot be assessed on this non-gated CT examination. Assessment for potential risk factor modification, dietary therapy or pharmacologic therapy may be warranted, if clinically indicated. 3. 2.0 x 1.5 cm right adrenal nodule, indeterminate. Statistically, this is likely benign. If there is no prior history of cancer, follow-up adrenal protocol CT scan would be recommended in 12 months. Alternatively, if there is a prior history of cancer, adrenal protocol CT in the near future would be recommended. Aortic Atherosclerosis (ICD10-I70.0). Electronically Signed   By: Vinnie Langton M.D.   On: 10/05/2019 17:01   DG Chest Port 1 View  Result Date: 10/05/2019 CLINICAL DATA:  64 year old female with shortness of breath. EXAM: PORTABLE CHEST 1 VIEW COMPARISON:  None. FINDINGS: Mild chronic bronchitic changes. No focal  consolidation, pleural effusion or pneumothorax. Borderline cardiomegaly. Atherosclerotic calcification of the aortic arch. No acute osseous pathology. Thyroidectomy surgical clips. IMPRESSION: No acute cardiopulmonary process. Electronically Signed   By: Anner Crete M.D.   On: 10/05/2019 15:28    Procedures Procedures (including critical care time)  Medications Ordered in ED Medications - No data to display  ED Course  I have reviewed the triage vital signs and the nursing notes.  Pertinent labs & imaging results that were available during my care of the patient were reviewed by me and considered in my medical decision making (see chart for details).  Clinical Course as of Oct 04 1434  Sat Oct 05, 2019  1427 Oxygen sat 100% at bedside   [JK]    Clinical Course User Index [JK] Dorie Rank, MD   MDM Rules/Calculators/A&P                      Pt presented with primary complaint of confusion but noted to be hypoxic.  Hx difficult with pt's hearing loss and confusion.  Additional hx obtained from family. o2 sat responded to supplemental oxygen. ?pna, covid, pe, chf. Proceed with ed workup, labs, cxr,will add head ct with confusion continue to monitor closely.  Case turned over to Dr Darl Householder Final Clinical Impression(s) / ED Diagnoses pending   Dorie Rank, MD 10/06/19 [phone]

## 2019-10-05 NOTE — ED Notes (Signed)
IV attempt x2  Unsuccessful.  

## 2019-10-05 NOTE — ED Triage Notes (Signed)
Patient arrived via [facility] from home. Patient is AOx1 to AOx2 and non-ambulatory at baseline. Patient chief complaint is Shortness of Breath. Unknown what type of cancer patient has or had. EMS stated patient had O2 saturation in low 60's to high 60's. Patient family did not appear to know a lot of information about patient and were not very helpful historians. No paperwork or information from family at bedside.  20g in left hand

## 2019-10-05 NOTE — ED Notes (Signed)
Pt was standing at door with no clothes on and urine and blood all over the floor.  Dr Wilson Singer assisted pt back into bed and said she pulled out one of her IVs.  Rn and this writer set the pt back up on the monitor and now pt has mittens on.  Pt is back on purewick as well.

## 2019-10-05 NOTE — ED Notes (Signed)
Patient transported to CT 

## 2019-10-05 NOTE — H&P (Signed)
History and Physical    Loretta Brooks [id number] DOB: 1955-12-13 DOA: 10/05/2019  PCP: Dr. Nolene Ebbs at [facility], [place] location. [phone]  Patient coming from: Home.  Wheelchair-bound at baseline.  Dependent for some ADLs  Chief Complaint: Shortness of breath and confusion  HPI: Loretta Brooks is a 64 y.o. female with history of breast cancer in remission, thyroid and neck cancer, DM-2, COPD not on oxygen, PE on Xarelto, HTN, hypothyroidism, hearing impairment, depression, anxiety and chronic debility presenting with shortness of breath, confusion, weakness and poor p.o. intake for about 3 days.   Patient was not able to provide history due to severe hearing impairment.  She is awake and follows command with sign instruction.   Per patient's husband, patient has been having intermittent left-sided stiffness, difficulty breathing and strange behavior for the last 3 days.  She has been rolling her eyes back.  "It is just hard to describe what she has been doing for the last 3 days" and he called EMS today.  He denies history of seizure or stroke.  He denies fever, nausea, vomiting, cough or diarrhea.  Denies focal weakness.  Reports chronic slurred speech.  He says she has not been able to mobilize for years.  She is wheelchair-bound.  Dependent for some ADLs.  Per patient's husband, she does take a lot of medication including blood thinners but not sure if she has been consistent as she does not let the family manage her medications.  She has not been vocal or hear for over a month.   Lives with her husband.  Per husband, denies smoking cigarettes, drinking alcohol recreational drug use.  In ED, hypoxic to 68% on RA but recovered to 100% with NRB, and weaned to 2.5 L by HFNC.  Hemodynamically stable.  ABG not impressive but obtained on NRB.  WBC 14.5.  Hgb 7.1 (no baseline).  Creatinine 1.22.  Glucose 130.  D-dimer 3.91.  Lactic acid 4.5.  BNP and troponin  within normal.  COVID-19 antigen test negative.  FOBT positive. CXR and CT chest without acute finding.  CTA chest with multiple small nonocclusive and subsegmental PE in left lung, right adrenal nodule, atherosclerosis and calcified coronaries.  Per EDP, [facility], Dr. Therisa Doyne consulted and okay with heparin drip for PE.  Blood culture, anemia panel, and 1 unit of blood ordered.  Received 1 L NS bolus.  Started on vancomycin and cefepime and hospital service was called for admission.  Confirmatory Covid test pending.  ROS Per HPI from patient's husband.  Patient was not able to provide history.  PMH No past medical history on file.  Per HPI.  Fam HX Not able to obtain this due to patient's hearing impairment or mental status.  Social Hx Per husband, former smoker.  Denies drinking alcohol recreational drug use.  Allergy No known allergies to medication per husband.  Keflex listed under care everywhere but tolerating cefepime.  Home Meds Prior to Admission medications   Medication Sig Start Date End Date Taking? Authorizing Provider  Accu-Chek FastClix Lancets MISC 1 each by Other route daily. 08/21/19   [provider]  ACCU-CHEK GUIDE test strip 1 each by Other route daily. 09/12/19   [provider]  amLODipine (NORVASC) 10 MG tablet Take 10 mg by mouth daily. LF 12.17.2020 #90 09/05/19   [provider]  baclofen (LIORESAL) 10 MG tablet Take 10 mg by mouth 3 (three) times daily. LF 12.21.2020 #270 09/09/19   [provider]  escitalopram (LEXAPRO) 20 MG tablet Take 20 mg by mouth daily. LF 12.22.2020 #90 09/10/19   [provider]  hydrOXYzine (ATARAX/VISTARIL) 25 MG tablet Take 25 mg by mouth 2 (two) times daily as needed. LF 12.18.2020 #60 09/06/19   [provider]  levothyroxine (SYNTHROID) 200 MCG tablet Take 200 mcg by mouth daily. LF 10.21.2020 #90 07/10/19   [provider]  metFORMIN (GLUCOPHAGE) 500 MG tablet Take  500 mg by mouth 2 (two) times daily. LF 12.3.2020 #180 08/22/19   [provider]  methocarbamol (ROBAXIN) 500 MG tablet Take 500 mg by mouth 2 (two) times daily as needed for pain. LF 8.25.2020 #60 05/14/19   [provider]  metoprolol succinate (TOPROL-XL) 25 MG 24 hr tablet Take 25 mg by mouth 2 (two) times daily. LF 10.25.2020 #180 07/14/19   [provider]  sulfamethoxazole-trimethoprim (BACTRIM DS) 800-160 MG tablet Take 1 tablet by mouth daily. Last Filled 12.31.2020 #90 09/19/19   [provider]  tiZANidine (ZANAFLEX) 4 MG tablet Take 4 mg by mouth 2 (two) times daily as needed for pain. LF 9.3.2020 #180 05/23/19   [provider]  zolpidem (AMBIEN CR) 12.5 MG CR tablet Take 12.5 mg by mouth at bedtime as needed for sleep. LF 12.31.2020 #30 09/19/19   [provider]    Physical Exam: Vitals:   10/05/19 1728 10/05/19 1730 10/05/19 1745 10/05/19 1815  BP:  (!) 143/77 139/71 (!) 144/74  Pulse: 91 90 91 88  Resp: (!) 23 (!) 23 (!) 21 (!) 24  Temp:      TempSrc:      SpO2: 98% 98% 100% 100%  Weight:      Height:        GENERAL: No apparent distress.  Nontoxic. HEENT: MMM.  Vision grossly intact.  Severe hearing impairment.  Left facial deformity from previous surgery. NECK: Supple.  No apparent JVD.  No nuchal rigidity. RESP: 99% on 2.5 L by [place].  No IWOB.  Upper airway sounds transmitted throughout. CVS:  RRR . Heart sounds normal.  ABD/GI/GU: Bowel sounds present. Soft. Non tender.  MSK/EXT:  Moves extremities. No apparent deformity or edema.  SKIN: no apparent skin lesion or wound NEURO: Awake and follows sign commands.  PERRL.  Motor 4/5 in both upper extremities.  3/5 in both lower extremities.  Patellar reflex symmetric.  Intact alternating movements. PSYCH: Calm. Normal affect.   Personally Reviewed Radiological Exams CT Head Wo Contrast  Result Date: 10/05/2019 CLINICAL DATA:  Altered mental status. EXAM: CT HEAD  WITHOUT CONTRAST TECHNIQUE: Contiguous axial images were obtained from the base of the skull through the vertex without intravenous contrast. COMPARISON:  Feb 01, 2009 FINDINGS: Brain: No evidence of acute infarction, hemorrhage, hydrocephalus, extra-axial collection or mass lesion/mass effect. 8 mm area of hypoattenuation in the subcortical/periventricular right frontal lobe is stable. Vascular: No hyperdense vessel or unexpected calcification. Skull: No evidence of fracture. Postsurgical and post reconstruction changes from left maxillary resection. Sinuses/Orbits: The left sphenoid sinus and left mastoid air cells are opacified. Other: None. IMPRESSION: 1. No acute intracranial abnormality. 2. Chronic 8 mm area of hypoattenuation in the subcortical/periventricular right frontal lobe. 3. Opacification of the left sphenoid sinus and left mastoid air cells. Electronically Signed   By: Fidela Salisbury M.D.   On: 10/05/2019 15:31   CT Angio Chest PE W and/or Wo Contrast  Result Date: 10/05/2019 CLINICAL DATA:  64 year old female with history of shortness of breath. Elevated D-dimer.  EXAM: CT ANGIOGRAPHY CHEST WITH CONTRAST TECHNIQUE: Multidetector CT imaging of the chest was performed using the standard protocol during bolus administration of intravenous contrast. Multiplanar CT image reconstructions and MIPs were obtained to evaluate the vascular anatomy. CONTRAST:  172mL OMNIPAQUE IOHEXOL 350 MG/ML SOLN COMPARISON:  No priors. FINDINGS: Cardiovascular: Several nonocclusive filling defects are noted within segmental and subsegmental sized pulmonary artery branches in the left lung, most evident in the left upper lobe, compatible with pulmonary embolism. Heart size is mildly enlarged. There is no significant pericardial fluid, thickening or pericardial calcification. There is aortic atherosclerosis, as well as atherosclerosis of the great vessels of the mediastinum and the coronary arteries, including  calcified atherosclerotic plaque in the left main, left anterior descending, left circumflex and right coronary arteries. Mediastinum/Nodes: No pathologically enlarged mediastinal or hilar lymph nodes. Esophagus is unremarkable in appearance. No axillary lymphadenopathy. Lungs/Pleura: Linear areas of scarring and/or atelectasis throughout the mid to lower lungs bilaterally. No acute consolidative airspace disease. No pleural effusions. Upper Abdomen: 2.0 x 1.5 cm right adrenal nodule incompletely characterized. Aortic atherosclerosis. Musculoskeletal: There are no aggressive appearing lytic or blastic lesions noted in the visualized portions of the skeleton. Review of the MIP images confirms the above findings. IMPRESSION: 1. Multiple small nonocclusive segmental and subsegmental sized pulmonary emboli in the left lung. 2. Aortic atherosclerosis, in addition to left main and 3 vessel coronary artery disease. Please note that although the presence of coronary artery calcium documents the presence of coronary artery disease, the severity of this disease and any potential stenosis cannot be assessed on this non-gated CT examination. Assessment for potential risk factor modification, dietary therapy or pharmacologic therapy may be warranted, if clinically indicated. 3. 2.0 x 1.5 cm right adrenal nodule, indeterminate. Statistically, this is likely benign. If there is no prior history of cancer, follow-up adrenal protocol CT scan would be recommended in 12 months. Alternatively, if there is a prior history of cancer, adrenal protocol CT in the near future would be recommended. Aortic Atherosclerosis (ICD10-I70.0). Electronically Signed   By: Vinnie Langton M.D.   On: 10/05/2019 17:01   DG Chest Port 1 View  Result Date: 10/05/2019 CLINICAL DATA:  64 year old female with shortness of breath. EXAM: PORTABLE CHEST 1 VIEW COMPARISON:  None. FINDINGS: Mild chronic bronchitic changes. No focal consolidation, pleural  effusion or pneumothorax. Borderline cardiomegaly. Atherosclerotic calcification of the aortic arch. No acute osseous pathology. Thyroidectomy surgical clips. IMPRESSION: No acute cardiopulmonary process. Electronically Signed   By: Anner Crete M.D.   On: 10/05/2019 15:28     Personally Reviewed Labs: CBC: Recent Labs  Lab 10/05/19 1428  WBC 14.5*  HGB 7.1*  HCT 27.2*  MCV 76.8*  PLT [id number]*   Basic Metabolic Panel: Recent Labs  Lab 10/05/19 1428  NA 144  K 3.8  CL 108  CO2 22  GLUCOSE 130*  BUN 13  CREATININE 1.22*  CALCIUM 8.9   GFR: Estimated Creatinine Clearance: 54.2 mL/min (A) (by C-G formula based on SCr of 1.22 mg/dL (H)). Liver Function Tests: No results for input(s): AST, ALT, ALKPHOS, BILITOT, PROT, ALBUMIN in the last 168 hours. No results for input(s): LIPASE, AMYLASE in the last 168 hours. No results for input(s): AMMONIA in the last 168 hours. Coagulation Profile: Recent Labs  Lab 10/05/19 1428  INR 1.2   Cardiac Enzymes: No results for input(s): CKTOTAL, CKMB, CKMBINDEX, TROPONINI in the last 168 hours. BNP (last 3 results) No results for input(s): PROBNP in the last 8760 hours.  HbA1C: No results for input(s): HGBA1C in the last 72 hours. CBG: Recent Labs  Lab 10/05/19 1434  GLUCAP 136*   Lipid Profile: No results for input(s): CHOL, HDL, LDLCALC, TRIG, CHOLHDL, LDLDIRECT in the last 72 hours. Thyroid Function Tests: No results for input(s): TSH, T4TOTAL, FREET4, T3FREE, THYROIDAB in the last 72 hours. Anemia Panel: No results for input(s): VITAMINB12, FOLATE, FERRITIN, TIBC, IRON, RETICCTPCT in the last 72 hours. Urine analysis: No results found for: COLORURINE, APPEARANCEUR, LABSPEC, PHURINE, GLUCOSEU, HGBUR, BILIRUBINUR, KETONESUR, PROTEINUR, UROBILINOGEN, NITRITE, LEUKOCYTESUR  Sepsis Labs:  Lactic acid 4.5.  Personally Reviewed EKG:  EKG pending  Assessment/Plan Acute respiratory failure with hypoxia: desaturated to 68% on RA  requiring 15 L by NRB.  Now saturating in upper 90s on 2.5 L by [place].  CTA chest with multiple small nonocclusive left lung PE.  Has mild leukocytosis but no fever to suggest infectious process. ABG not consistent with COPD exacerbation.  Reportedly have left-sided stiffness with eye rolling concerning for seizure activity. Patient could have aspiration and subsequent aspiration pneumonitis.  -Manage PE as below. -Supplemental oxygen to maintain saturation greater than 92%. -Seizure and aspiration precautions. -Clear liquid diet after swallow screen. -Will watch off antibiotics unless she spikes a fever. -Follow cultures.  Acute left lung PE without evidence of right heart strain in patient with history of PE: unclear if this is new or old.  EDP started heparin drip after discussion with GI about positive Hemoccult.  Not clear if she has been consistent with home Xarelto. -Continue heparin drip -Closely monitor H&H every 6 hours.  Seizure-like activity: patient's husband reports intermittent left-sided stiffness, eye rolling and behavioral changes.  No history of seizure or stroke.  No focal neuro deficit but limited exam. -Seizure and aspiration precautions. -Check EEG -Consider neuro consult. -Neurovascular check every 4 hours.  Acute metabolic encephalopathy: No history of dementia.  No apparent focal neuro deficits.  She has severe hearing impairment but follows sign commands.  CT head without acute finding. -Seizure work-up as above. -Check TSH, ammonia, RPR and vitamin B12 -Fall precautions. -Frequent reorientation and delirium precautions.  GI bleed: Hemoccult positive. -[facility], Dr. Therisa Doyne consulted per EDP. -Secure two PIV -Check coags -Clear liquid diet after swallow screen. -Monitor H&H. -IV PPI  Microcytic anemia: Hgb 7.1.  MCV 77.  No recent values in a chart or Care Everywhere. -One unit of PRBC ordered by EDP. -Monitor H&H as above. -Follow anemia panel.  Chronic  COPD: presented with shortness of breath but no cough.  ABG not consistent with acute exacerbation. -Dulera, albuterol, ipratropium -Supplemental oxygen  Uncontrolled diabetes with hyperglycemia -Check hemoglobin A1c -SSI moderate and CBG monitoring.  Elevated creatinine: Creatinine 1.22.  BUN within normal.  No recent value -Continue monitoring  Lactic acidosis: Lactic acid 4.5.  Suspect this to be due to hypoxia versus sepsis. -Recheck  Essential hypertension: BP slightly elevated. -As needed labetalol pending med rec.  Debility/generalized weakness: Wheelchair-bound at baseline.  Dependent for most ADLs -PT/OT eval  Person under investigation for COVID-19 infection -Airborne and contact precautions. -Follow confirmatory test.  Severe hearing impairment: Reportedly for about a month. -Would consider MRI brain but not sure if possible with titanium plate in her face  Anxiety/depression -Resume home medications after med rec.  Hypothyroidism -Resume home Synthroid after med rec.  History of breast/thyroid/neck cancer: Reportedly she is in remission per patient's husband.  Goal of care discussion: Patient with the above comorbidities.  Wheelchair-bound and dependent for most ADLs.  Low palliative performance score.  She has severe hearing impairment.  Discussed the pros and cons of resuscitation and intubation with patient's husband over the phone.  I believe aggressive measures such as resuscitation intubation would pose more harm than benefit to the patient.  I recommended DNR/DNI but would like to keep her full code with full scope.  DVT prophylaxis: On heparin drip for PE  Code Status: Full code-confirmed with patient's husband over the phone as above Family Communication: Updated patient's husband over the phone  Disposition Plan: Admit to stepdown unit. Consults called: [facility], Dr. Therisa Doyne by EDP Admission status: Inpatient   Mercy Riding MD [facility]  If  7PM-7AM, please contact night-coverage [email or web address] Password [email or web address]  10/05/2019, 6:35 PM

## 2019-10-05 NOTE — ED Provider Notes (Addendum)
  Physical Exam  BP (!) 151/81   Pulse 91   Temp 98.6 F (37 C) (Oral)   Resp (!) 23   Ht 5\' 7"  (1.702 m)   Wt 91.9 kg   SpO2 98%   BMI 31.72 kg/m   Physical Exam  ED Course/Procedures   Clinical Course as of Oct 04 1728  Sat Oct 05, 2019  1427 Oxygen sat 100% at bedside   [JK]    Clinical Course User Index [JK] Dorie Rank, MD    Procedures  MDM  Care assumed at 3:30 pm.  Patient is here for shortness of breath and hypoxia.  Signout pending Covid, D-dimer, chest x-ray.  Her ABG was reassuring on nonrebreather. Patient's oxygen was 86% on room air per the nursing staff.  Given her new onset oxygen requirement, patient likely needs admission.  5:31 PM Hg is 7.1. Baseline is 10. Occ positive. Had dark stool, not bright red.  CTA showed subsegmental PEs.  I wonder if she had a slow GI bleed versus symptomatic anemia. Anemia panel sent. Dr. Therisa Doyne to see patient. Started on heparin for PE. Hospitalist to admit.   CRITICAL CARE Performed by: Wandra Arthurs   Total critical care time: 30 minutes  Critical care time was exclusive of separately billable procedures and treating other patients.  Critical care was necessary to treat or prevent imminent or life-threatening deterioration.  Critical care was time spent personally by me on the following activities: development of treatment plan with patient and/or surrogate as well as nursing, discussions with consultants, evaluation of patient's response to treatment, examination of patient, obtaining history from patient or surrogate, ordering and performing treatments and interventions, ordering and review of laboratory studies, ordering and review of radiographic studies, pulse oximetry and re-evaluation of patient's condition.    Drenda Freeze, MD 10/05/19 1739    Drenda Freeze, MD 10/05/19 [phone]

## 2019-10-05 NOTE — ED Triage Notes (Signed)
Follow up from below note during med hx interview- Spoke with pt son @[phone]  states pt is in remission from breast cancer, thyroid cancer & neck cancer. PCP is Dr. Nolene Ebbs at [facility], [place] location. [phone] # for clinic.

## 2019-10-05 NOTE — ED Notes (Addendum)
Pt. Had two sets of blood cultures drawn that were labeled and sent off to the lab. Grey top tube was also sent to the lab.Nurse aware.

## 2019-10-05 NOTE — Progress Notes (Signed)
ANTICOAGULATION CONSULT NOTE - Initial Consult  Pharmacy Consult for Heparin Indication: pulmonary embolus  Not on File  Patient Measurements: Height: 5\' 7"  (170.2 cm) Weight: 202 lb 8 oz (91.9 kg) IBW/kg (Calculated) : 61.6 HEPARIN DW (KG): 81.5  Vital Signs: Temp: 98.6 F (37 C) (01/16 1425) Temp Source: Oral (01/16 1425) BP: 167/88 (01/16 1633) Pulse Rate: 92 (01/16 1633)  Labs: Recent Labs    10/05/19 1428  HGB 7.1*  HCT 27.2*  PLT 493*  CREATININE 1.22*  TROPONINIHS 3    Estimated Creatinine Clearance: 54.2 mL/min (A) (by C-G formula based on SCr of 1.22 mg/dL (H)).   Medical History: No past medical history on file.  Medications:  Infusions:  . vancomycin 1,000 mg (10/05/19 1659)   No anticoagulation PTA  Assessment: 64 yo F with new dx PE.  Not on anticoagulation PTA.   Noted patient has a Hg 7.1, guaic +.  Per EDP will proceed with heparin-no bolus.  No overt bleeding noted.   Goal of Therapy:  Heparin level 0.3-0.7 units/ml Monitor platelets by anticoagulation protocol: Yes   Plan:  NO Bolus per Dr Darl Householder due to bleed risk Start IV Heparin infusion at 1200 units/hr Will target lower end of goal range to minimize bleed risk Check 8h heparin level & CBC after infusion starts Daily heparin level & CBC while on heparin Monitor closely for s/sx of bleeding  Netta Cedars, PharmD, BCPS 10/05/2019,5:07 PM

## 2019-10-05 NOTE — ED Notes (Signed)
Patient has been changed and cleaned. 

## 2019-10-05 NOTE — ED Notes (Signed)
RT at bedside with patient

## 2019-10-06 ENCOUNTER — Encounter (HOSPITAL_COMMUNITY): Payer: Self-pay | Admitting: Student

## 2019-10-06 ENCOUNTER — Inpatient Hospital Stay (HOSPITAL_COMMUNITY): Payer: Medicare Other

## 2019-10-06 DIAGNOSIS — J449 Chronic obstructive pulmonary disease, unspecified: Secondary | ICD-10-CM

## 2019-10-06 DIAGNOSIS — J9621 Acute and chronic respiratory failure with hypoxia: Secondary | ICD-10-CM

## 2019-10-06 DIAGNOSIS — D509 Iron deficiency anemia, unspecified: Secondary | ICD-10-CM

## 2019-10-06 DIAGNOSIS — G9341 Metabolic encephalopathy: Secondary | ICD-10-CM

## 2019-10-06 DIAGNOSIS — E1165 Type 2 diabetes mellitus with hyperglycemia: Secondary | ICD-10-CM

## 2019-10-06 DIAGNOSIS — I10 Essential (primary) hypertension: Secondary | ICD-10-CM

## 2019-10-06 DIAGNOSIS — I2699 Other pulmonary embolism without acute cor pulmonale: Secondary | ICD-10-CM

## 2019-10-06 LAB — HEPARIN LEVEL (UNFRACTIONATED)
Heparin Unfractionated: 0.23 IU/mL — ABNORMAL LOW (ref 0.30–0.70)
Heparin Unfractionated: 0.4 IU/mL (ref 0.30–0.70)
Heparin Unfractionated: 0.37 IU/mL (ref 0.30–0.70)

## 2019-10-06 LAB — CBC
HCT: 27.6 % — ABNORMAL LOW (ref 36.0–46.0)
HCT: 29.2 % — ABNORMAL LOW (ref 36.0–46.0)
Hemoglobin: 7.7 g/dL — ABNORMAL LOW (ref 12.0–15.0)
Hemoglobin: 8.1 g/dL — ABNORMAL LOW (ref 12.0–15.0)
MCH: 22 pg — ABNORMAL LOW (ref 26.0–34.0)
MCH: 22 pg — ABNORMAL LOW (ref 26.0–34.0)
MCHC: 27.9 g/dL — ABNORMAL LOW (ref 30.0–36.0)
MCHC: 27.7 g/dL — ABNORMAL LOW (ref 30.0–36.0)
MCV: 78.9 fL — ABNORMAL LOW (ref 80.0–100.0)
MCV: 79.1 fL — ABNORMAL LOW (ref 80.0–100.0)
Platelets: 421 10*3/uL — ABNORMAL HIGH (ref 150–400)
Platelets: 447 10*3/uL — ABNORMAL HIGH (ref 150–400)
RBC: 3.5 MIL/uL — ABNORMAL LOW (ref 3.87–5.11)
RBC: 3.69 MIL/uL — ABNORMAL LOW (ref 3.87–5.11)
RDW: 22.1 % — ABNORMAL HIGH (ref 11.5–15.5)
RDW: 21.6 % — ABNORMAL HIGH (ref 11.5–15.5)
WBC: 10.9 10*3/uL — ABNORMAL HIGH (ref 4.0–10.5)
WBC: 10.7 10*3/uL — ABNORMAL HIGH (ref 4.0–10.5)
nRBC: 0 % (ref 0.0–0.2)
nRBC: 0 % (ref 0.0–0.2)

## 2019-10-06 LAB — COMPREHENSIVE METABOLIC PANEL
ALT: 9 U/L (ref 0–44)
AST: 15 U/L (ref 15–41)
Albumin: 3.6 g/dL (ref 3.5–5.0)
Alkaline Phosphatase: 79 U/L (ref 38–126)
Anion gap: 11 (ref 5–15)
BUN: 10 mg/dL (ref 8–23)
CO2: 22 mmol/L (ref 22–32)
Calcium: 8.3 mg/dL — ABNORMAL LOW (ref 8.9–10.3)
Chloride: 110 mmol/L (ref 98–111)
Creatinine, Ser: 0.93 mg/dL (ref 0.44–1.00)
GFR calc Af Amer: >60 mL/min (ref >60)
GFR calc non Af Amer: >60 mL/min (ref >60)
Glucose, Bld: 115 mg/dL — ABNORMAL HIGH (ref 70–99)
Potassium: 3.9 mmol/L (ref 3.5–5.1)
Sodium: 143 mmol/L (ref 135–145)
Total Bilirubin: 0.3 mg/dL (ref 0.3–1.2)
Total Protein: 7 g/dL (ref 6.5–8.1)

## 2019-10-06 LAB — HEMOGLOBIN AND HEMATOCRIT, BLOOD
HCT: 28.9 % — ABNORMAL LOW (ref 36.0–46.0)
HCT: 29 % — ABNORMAL LOW (ref 36.0–46.0)
HCT: 29.3 % — ABNORMAL LOW (ref 36.0–46.0)
Hemoglobin: 8 g/dL — ABNORMAL LOW (ref 12.0–15.0)
Hemoglobin: 8.2 g/dL — ABNORMAL LOW (ref 12.0–15.0)
Hemoglobin: 8 g/dL — ABNORMAL LOW (ref 12.0–15.0)

## 2019-10-06 LAB — RPR: RPR Ser Ql: NONREACTIVE

## 2019-10-06 LAB — HEMOGLOBIN A1C
Hgb A1c MFr Bld: 5.5 % (ref 4.8–5.6)
Mean Plasma Glucose: 111.15 mg/dL

## 2019-10-06 LAB — CBG MONITORING, ED
Glucose-Capillary: 114 mg/dL — ABNORMAL HIGH (ref 70–99)
Glucose-Capillary: 95 mg/dL (ref 70–99)
Glucose-Capillary: 90 mg/dL (ref 70–99)

## 2019-10-06 LAB — SARS CORONAVIRUS 2 (TAT 6-24 HRS): SARS Coronavirus 2: NEGATIVE

## 2019-10-06 LAB — HIV ANTIBODY (ROUTINE TESTING W REFLEX): HIV Screen 4th Generation wRfx: NONREACTIVE

## 2019-10-06 MED ORDER — SENNOSIDES-DOCUSATE SODIUM 8.6-50 MG PO TABS: 1.0000 | ORAL_TABLET | Freq: Every evening | ORAL | Status: DC | PRN

## 2019-10-06 MED ORDER — ALBUTEROL SULFATE (2.5 MG/3ML) 0.083% IN NEBU: 2.5000 mg | INHALATION_SOLUTION | RESPIRATORY_TRACT | Status: DC | PRN

## 2019-10-06 MED ORDER — IOHEXOL 300 MG/ML  SOLN
80.0000 mL | Freq: Once | INTRAMUSCULAR | Status: AC | PRN
  Administered 2019-10-06: 16:00:00 100 mL via INTRAVENOUS

## 2019-10-06 MED ORDER — FLEET ENEMA 7-19 GM/118ML RE ENEM: 1.0000 | ENEMA | Freq: Once | RECTAL | Status: DC | PRN

## 2019-10-06 MED ORDER — SODIUM CHLORIDE 0.9 % IV SOLN
510.0000 mg | Freq: Once | INTRAVENOUS | Status: AC
  Administered 2019-10-06: 510 mg via INTRAVENOUS
  Filled 2019-10-06: qty 17

## 2019-10-06 MED ORDER — LABETALOL HCL 5 MG/ML IV SOLN
10.0000 mg | INTRAVENOUS | Status: DC | PRN
  Filled 2019-10-06 (×2): qty 4

## 2019-10-06 MED ORDER — ONDANSETRON HCL 4 MG PO TABS: 4.0000 mg | ORAL_TABLET | Freq: Four times a day (QID) | ORAL | Status: DC | PRN

## 2019-10-06 MED ORDER — ONDANSETRON HCL 4 MG/2ML IJ SOLN: 4.0000 mg | Freq: Four times a day (QID) | INTRAMUSCULAR | Status: DC | PRN

## 2019-10-06 MED ORDER — SODIUM CHLORIDE (PF) 0.9 % IJ SOLN
INTRAMUSCULAR | Status: AC
  Filled 2019-10-06: qty 50

## 2019-10-06 MED ORDER — ACETAMINOPHEN 650 MG RE SUPP: 650.0000 mg | Freq: Four times a day (QID) | RECTAL | Status: DC | PRN

## 2019-10-06 MED ORDER — IPRATROPIUM BROMIDE HFA 17 MCG/ACT IN AERS
2.0000 | INHALATION_SPRAY | Freq: Four times a day (QID) | RESPIRATORY_TRACT | Status: DC
  Administered 2019-10-07 (×2): 2 via RESPIRATORY_TRACT
  Filled 2019-10-06: qty 12.9

## 2019-10-06 MED ORDER — INSULIN ASPART 100 UNIT/ML ~~LOC~~ SOLN
0.0000 [IU] | Freq: Three times a day (TID) | SUBCUTANEOUS | Status: DC
  Administered 2019-10-07: 12:00:00 2 [IU] via SUBCUTANEOUS
  Filled 2019-10-06: qty 0.15

## 2019-10-06 MED ORDER — MOMETASONE FURO-FORMOTEROL FUM 200-5 MCG/ACT IN AERO
2.0000 | INHALATION_SPRAY | Freq: Two times a day (BID) | RESPIRATORY_TRACT | Status: DC
  Administered 2019-10-06 – 2019-10-09 (×7): 2 via RESPIRATORY_TRACT
  Filled 2019-10-06 (×2): qty 8.8

## 2019-10-06 MED ORDER — OXYCODONE HCL 5 MG PO TABS
5.0000 mg | ORAL_TABLET | ORAL | Status: DC | PRN
  Administered 2019-10-06 – 2019-10-08 (×2): 5 mg via ORAL
  Filled 2019-10-06 (×2): qty 1

## 2019-10-06 MED ORDER — BISACODYL 10 MG RE SUPP: 10.0000 mg | Freq: Every day | RECTAL | Status: DC | PRN

## 2019-10-06 MED ORDER — INSULIN ASPART 100 UNIT/ML ~~LOC~~ SOLN
0.0000 [IU] | Freq: Every day | SUBCUTANEOUS | Status: DC
  Filled 2019-10-06: qty 0.05

## 2019-10-06 MED ORDER — DOCUSATE SODIUM 100 MG PO CAPS
100.0000 mg | ORAL_CAPSULE | Freq: Two times a day (BID) | ORAL | Status: DC
  Administered 2019-10-06 – 2019-10-09 (×4): 100 mg via ORAL
  Filled 2019-10-06 (×8): qty 1

## 2019-10-06 MED ORDER — IPRATROPIUM BROMIDE HFA 17 MCG/ACT IN AERS
2.0000 | INHALATION_SPRAY | RESPIRATORY_TRACT | Status: DC
  Administered 2019-10-06 (×5): 2 via RESPIRATORY_TRACT
  Filled 2019-10-06: qty 12.9

## 2019-10-06 MED ORDER — ACETAMINOPHEN 325 MG PO TABS: 650.0000 mg | ORAL_TABLET | Freq: Four times a day (QID) | ORAL | Status: DC | PRN

## 2019-10-06 NOTE — Progress Notes (Signed)
ANTICOAGULATION CONSULT NOTE - Follow Up Consult  Pharmacy Consult for heparin Indication: pulmonary embolus  Not on File  Patient Measurements: Height: 5\' 7"  (170.2 cm) Weight: 202 lb 8 oz (91.9 kg) IBW/kg (Calculated) : 61.6 Heparin Dosing Weight:   Vital Signs: Temp: 97.9 F (36.6 C) (01/17 0217) Temp Source: Oral (01/17 0217) BP: 158/85 (01/17 0515) Pulse Rate: 74 (01/17 0515)  Labs: Recent Labs    10/05/19 1428 10/06/19 0416  HGB 7.1* 7.7*  HCT 27.2* 27.6*  PLT 493* 421*  APTT 30  --   LABPROT 15.3*  --   INR 1.2  --   HEPARINUNFRC  --  0.23*  CREATININE 1.22* 0.93  TROPONINIHS 3  --     Estimated Creatinine Clearance: 71.1 mL/min (by C-G formula based on SCr of 0.93 mg/dL).   Medications:  Infusions:  . sodium chloride Stopped (10/05/19 1727)  . heparin 1,200 Units/hr (10/06/19 0243)    Assessment: Patient with low heparin level.  No heparin issues per RN.  Goal of Therapy:  Heparin level 0.3-0.7 units/ml Monitor platelets by anticoagulation protocol: Yes   Plan:  Increase heparin to 1300 units/hr Recheck heparin level at [address], [place] 10/06/2019,5:45 AM

## 2019-10-06 NOTE — Consult Note (Addendum)
[facility] Gastroenterology Consult  Referring Provider: Drenda Freeze, MD/ER Primary Care Physician:  Tanna Savoy, MD Primary Gastroenterologist: Althia Forts  Reason for Consultation: Anemia, pulmonary embolism, need for anticoagulation  HPI: Loretta Brooks is a 64 y.o. female is very hard of hearing. Most of the history is obtained from documentation, from care everywhere, most of her treatment is from [facility] and her husband Mr. Courtnie Munuz over the phone [phone].  Patient has severe sensorineural hearing loss, history of breast cancer-2009, lumpectomy 2010, head and neck squamous cell cancer, acute pulmonary embolism diagnosed in 02/2013 on Xarelto, hypertension, papillary thyroid cancer, underwent thyroidectomy in June 2011, stage IV maxillary adenocarcinoma.  As per husband for the last 2 to 3 days, she was not making any sense while talking, was noted to have breathing problems, states her eyes rolled back and she got stiff.  Normally she walks with support but does have a mobile chair for ambulation.  She takes her medication by herself. It is unclear if she has ever had an endoscopy or colonoscopy in the past. There is no history of black stools, blood in stool, nausea, vomiting, diarrhea, change in bowel habits, weight loss or loss of appetite.  Past medical history: Breast cancer Allergy Anxiety Arthritis Depression Emphysema of lungs Papillary adenocarcinoma of thyroid Head and neck cancer 2012 Past smoker Hypertension  Past surgical history: Breast lumpectomy 2010 Hysterectomy Breast biopsy  Home medications: Xarelto lbuterol Amlodipine Baclofen Symbicort Citracal plus Diclofenac Lexapro Norco Hydroxyzine Levothyroxine Claritin Metformin Robaxin Metoprolol Simcor Potassium Bactrim Zanaflex Zolpidem   Prior to Admission medications   Medication Sig Start Date End Date Taking? Authorizing Provider  Accu-Chek FastClix  Lancets MISC 1 each by Other route daily. 08/21/19   [provider]  ACCU-CHEK GUIDE test strip 1 each by Other route daily. 09/12/19   [provider]  amLODipine (NORVASC) 10 MG tablet Take 10 mg by mouth daily. LF 12.17.2020 #90 09/05/19   [provider]  baclofen (LIORESAL) 10 MG tablet Take 10 mg by mouth 3 (three) times daily. LF 12.21.2020 #270 09/09/19   [provider]  escitalopram (LEXAPRO) 20 MG tablet Take 20 mg by mouth daily. LF 12.22.2020 #90 09/10/19   [provider]  hydrOXYzine (ATARAX/VISTARIL) 25 MG tablet Take 25 mg by mouth 2 (two) times daily as needed. LF 12.18.2020 #60 09/06/19   [provider]  levothyroxine (SYNTHROID) 200 MCG tablet Take 200 mcg by mouth daily. LF 10.21.2020 #90 07/10/19   [provider]  metFORMIN (GLUCOPHAGE) 500 MG tablet Take 500 mg by mouth 2 (two) times daily. LF 12.3.2020 #180 08/22/19   [provider]  methocarbamol (ROBAXIN) 500 MG tablet Take 500 mg by mouth 2 (two) times daily as needed for pain. LF 8.25.2020 #60 05/14/19   [provider]  metoprolol succinate (TOPROL-XL) 25 MG 24 hr tablet Take 25 mg by mouth 2 (two) times daily. LF 10.25.2020 #180 07/14/19   [provider]  sulfamethoxazole-trimethoprim (BACTRIM DS) 800-160 MG tablet Take 1 tablet by mouth daily. Last Filled 12.31.2020 #90 09/19/19   [provider]  tiZANidine (ZANAFLEX) 4 MG tablet Take 4 mg by mouth 2 (two) times daily as needed for pain. LF 9.3.2020 #180 05/23/19   [provider]  zolpidem (AMBIEN CR) 12.5 MG CR tablet Take 12.5 mg by mouth at bedtime as needed for sleep. LF 12.31.2020 #30 09/19/19   [provider]    Current Facility-Administered Medications  Medication Dose Route  Frequency Provider Last Rate Last Admin  . 0.9 %  sodium chloride infusion  10 mL/hr Intravenous Once Mercy Riding, MD   Stopped at 10/05/19 1727  . acetaminophen  (TYLENOL) tablet 650 mg  650 mg Oral Q6H PRN Mercy Riding, MD       Or  . acetaminophen (TYLENOL) suppository 650 mg  650 mg Rectal Q6H PRN Gonfa, Taye T, MD      . albuterol (PROVENTIL) (2.5 MG/3ML) 0.083% nebulizer solution 2.5 mg  2.5 mg Nebulization Q2H PRN Gonfa, Taye T, MD      . bisacodyl (DULCOLAX) suppository 10 mg  10 mg Rectal Daily PRN Wendee Beavers T, MD      . docusate sodium (COLACE) capsule 100 mg  100 mg Oral BID Wendee Beavers T, MD   100 mg at 10/06/19 0415  . heparin ADULT infusion 100 units/mL (25000 units/214mL sodium chloride 0.45%)  1,300 Units/hr Intravenous Continuous Wendee Beavers T, MD 13 mL/hr at 10/06/19 0552 1,300 Units/hr at 10/06/19 0552  . insulin aspart (novoLOG) injection 0-15 Units  0-15 Units Subcutaneous TID WC Gonfa, Taye T, MD      . insulin aspart (novoLOG) injection 0-5 Units  0-5 Units Subcutaneous QHS Gonfa, Taye T, MD      . ipratropium (ATROVENT HFA) inhaler 2 puff  2 puff Inhalation Q4H Mercy Riding, MD   2 puff at 10/06/19 0734  . labetalol (NORMODYNE) injection 10 mg  10 mg Intravenous Q2H PRN Gonfa, Taye T, MD      . mometasone-formoterol (DULERA) 200-5 MCG/ACT inhaler 2 puff  2 puff Inhalation BID Mercy Riding, MD   2 puff at 10/06/19 0923  . ondansetron (ZOFRAN) tablet 4 mg  4 mg Oral Q6H PRN Gonfa, Taye T, MD       Or  . ondansetron (ZOFRAN) injection 4 mg  4 mg Intravenous Q6H PRN Gonfa, Taye T, MD      . oxyCODONE (Oxy IR/ROXICODONE) immediate release tablet 5 mg  5 mg Oral Q4H PRN Wendee Beavers T, MD   5 mg at 10/06/19 0415  . pantoprazole (PROTONIX) injection 40 mg  40 mg Intravenous Q12H Wendee Beavers T, MD   40 mg at 10/05/19 2143  . senna-docusate (Senokot-S) tablet 1 tablet  1 tablet Oral QHS PRN Gonfa, Taye T, MD      . sodium chloride flush (NS) 0.9 % injection 10-40 mL  10-40 mL Intracatheter Q12H Wendee Beavers T, MD   10 mL at 10/06/19 0211  . sodium chloride flush (NS) 0.9 % injection 10-40 mL  10-40 mL Intracatheter PRN Gonfa, Taye T, MD       . sodium phosphate (FLEET) 7-19 GM/118ML enema 1 enema  1 enema Rectal Once PRN Mercy Riding, MD       Current Outpatient Medications  Medication Sig Dispense Refill  . Accu-Chek FastClix Lancets MISC 1 each by Other route daily.    Marland Kitchen ACCU-CHEK GUIDE test strip 1 each by Other route daily.    Marland Kitchen amLODipine (NORVASC) 10 MG tablet Take 10 mg by mouth daily. LF 12.17.2020 #90    . baclofen (LIORESAL) 10 MG tablet Take 10 mg by mouth 3 (three) times daily. LF 12.21.2020 #270    . escitalopram (LEXAPRO) 20 MG tablet Take 20 mg by mouth daily. LF 12.22.2020 #90    . hydrOXYzine (ATARAX/VISTARIL) 25 MG tablet Take 25 mg by mouth 2 (two) times daily as needed. LF 12.18.2020 #60    .  levothyroxine (SYNTHROID) 200 MCG tablet Take 200 mcg by mouth daily. LF 10.21.2020 #90    . metFORMIN (GLUCOPHAGE) 500 MG tablet Take 500 mg by mouth 2 (two) times daily. LF 12.3.2020 #180    . methocarbamol (ROBAXIN) 500 MG tablet Take 500 mg by mouth 2 (two) times daily as needed for pain. LF 8.25.2020 #60    . metoprolol succinate (TOPROL-XL) 25 MG 24 hr tablet Take 25 mg by mouth 2 (two) times daily. LF 10.25.2020 #180    . sulfamethoxazole-trimethoprim (BACTRIM DS) 800-160 MG tablet Take 1 tablet by mouth daily. Last Filled 12.31.2020 #90    . tiZANidine (ZANAFLEX) 4 MG tablet Take 4 mg by mouth 2 (two) times daily as needed for pain. LF 9.3.2020 #180    . zolpidem (AMBIEN CR) 12.5 MG CR tablet Take 12.5 mg by mouth at bedtime as needed for sleep. LF 12.31.2020 #30      Allergies as of 10/05/2019  . (Not on File)    History reviewed. No pertinent family history.  Social History   Socioeconomic History  . Marital status: Married    Spouse name: Not on file  . Number of children: Not on file  . Years of education: Not on file  . Highest education level: Not on file  Occupational History  . Not on file  Tobacco Use  . Smoking status: Not on file  Substance and Sexual Activity  . Alcohol use: Not on  file  . Drug use: Not on file  . Sexual activity: Not on file  Other Topics Concern  . Not on file  Social History Narrative  . Not on file   Social Determinants of Health   Financial Resource Strain:   . Difficulty of Paying Living Expenses: Not on file  Food Insecurity:   . Worried About Charity fundraiser in the Last Year: Not on file  . Ran Out of Food in the Last Year: Not on file  Transportation Needs:   . Lack of Transportation (Medical): Not on file  . Lack of Transportation (Non-Medical): Not on file  Physical Activity:   . Days of Exercise per Week: Not on file  . Minutes of Exercise per Session: Not on file  Stress:   . Feeling of Stress : Not on file  Social Connections:   . Frequency of Communication with Friends and Family: Not on file  . Frequency of Social Gatherings with Friends and Family: Not on file  . Attends Religious Services: Not on file  . Active Member of Clubs or Organizations: Not on file  . Attends Archivist Meetings: Not on file  . Marital Status: Not on file  Intimate Partner Violence:   . Fear of Current or Ex-Partner: Not on file  . Emotionally Abused: Not on file  . Physically Abused: Not on file  . Sexually Abused: Not on file    Review of Systems: Positive for: GI: Described in detail in HPI.    Gen: Denies any fever, chills, rigors, night sweats, anorexia, fatigue, weakness, malaise, involuntary weight loss, and sleep disorder CV: Denies chest pain, angina, palpitations, syncope, orthopnea, PND, peripheral edema, and claudication. Resp: dyspnea, Denies cough, sputum, wheezing, coughing up blood. GU : Denies urinary burning, blood in urine, urinary frequency, urinary hesitancy, nocturnal urination, and urinary incontinence. MS: Denies joint pain or swelling.  Denies muscle weakness, cramps, atrophy.  Derm: Denies rash, itching, oral ulcerations, hives, unhealing ulcers.  Psych: Denies depression, anxiety, memory loss,  suicidal ideation, hallucinations,  and confusion. Heme: Denies bruising, bleeding, and enlarged lymph nodes. Neuro:  Denies any headaches, dizziness, paresthesias. Endo:   DM, hypothyroid, Denies any problems with adrenal function.  Physical Exam: Vital signs in last 24 hours: Temp:  [97.9 F (36.6 C)-98.6 F (37 C)] 97.9 F (36.6 C) (01/17 0550) Pulse Rate:  [65-104] 72 (01/17 0936) Resp:  [11-29] 20 (01/17 0936) BP: (122-173)/(68-104) 150/73 (01/17 0936) SpO2:  [68 %-100 %] 96 % (01/17 0936) FiO2 (%):  [100 %] 100 % (01/16 1442) Weight:  [91.9 kg] 91.9 kg (01/16 1412)    General:   Overweight, follows simple commands Head: Left eye deformity and atraumatic. Eyes: Left eye deformity, metal plate protruding Ears: Extremely hard of hearing  nose: Deformity over left side of the nose or lesions. Neck: Prior tracheostomy scar ,supple; no masses or thyromegaly. Lungs:  Clear throughout to auscultation.   No wheezes, crackles, or rhonchi. No acute distress. Heart:  Regular rate and rhythm; no murmurs, clicks, rubs,  or gallops. Extremities:  Without clubbing or edema. Neurologic:  Alert ;  grossly normal neurologically. Skin:  Intact without significant lesions or rashes. Psych:  Alert and cooperative. Normal mood and affect. Abdomen:  Soft, nontender and nondistended. No masses, hepatosplenomegaly or hernias noted. Normal bowel sounds, without guarding, and without rebound.         Lab Results: Recent Labs    10/05/19 1428 10/06/19 0416 10/06/19 0902  WBC 14.5* 10.9*  --   HGB 7.1* 7.7* 8.0*  HCT 27.2* 27.6* 28.9*  PLT 493* 421*  --    BMET Recent Labs    10/05/19 1428 10/06/19 0416  NA 144 143  K 3.8 3.9  CL 108 110  CO2 22 22  GLUCOSE 130* 115*  BUN 13 10  CREATININE 1.22* 0.93  CALCIUM 8.9 8.3*   LFT Recent Labs    10/06/19 0416  PROT 7.0  ALBUMIN 3.6  AST 15  ALT 9  ALKPHOS 79  BILITOT 0.3   PT/INR Recent Labs    10/05/19 1428  LABPROT  15.3*  INR 1.2    Studies/Results: CT Head Wo Contrast  Result Date: 10/05/2019 CLINICAL DATA:  Altered mental status. EXAM: CT HEAD WITHOUT CONTRAST TECHNIQUE: Contiguous axial images were obtained from the base of the skull through the vertex without intravenous contrast. COMPARISON:  Feb 01, 2009 FINDINGS: Brain: No evidence of acute infarction, hemorrhage, hydrocephalus, extra-axial collection or mass lesion/mass effect. 8 mm area of hypoattenuation in the subcortical/periventricular right frontal lobe is stable. Vascular: No hyperdense vessel or unexpected calcification. Skull: No evidence of fracture. Postsurgical and post reconstruction changes from left maxillary resection. Sinuses/Orbits: The left sphenoid sinus and left mastoid air cells are opacified. Other: None. IMPRESSION: 1. No acute intracranial abnormality. 2. Chronic 8 mm area of hypoattenuation in the subcortical/periventricular right frontal lobe. 3. Opacification of the left sphenoid sinus and left mastoid air cells. Electronically Signed   By: Fidela Salisbury M.D.   On: 10/05/2019 15:31   CT Angio Chest PE W and/or Wo Contrast  Result Date: 10/05/2019 CLINICAL DATA:  64 year old female with history of shortness of breath. Elevated D-dimer. EXAM: CT ANGIOGRAPHY CHEST WITH CONTRAST TECHNIQUE: Multidetector CT imaging of the chest was performed using the standard protocol during bolus administration of intravenous contrast. Multiplanar CT image reconstructions and MIPs were obtained to evaluate the vascular anatomy. CONTRAST:  152mL OMNIPAQUE IOHEXOL 350 MG/ML SOLN COMPARISON:  No priors. FINDINGS: Cardiovascular: Several nonocclusive filling defects are  noted within segmental and subsegmental sized pulmonary artery branches in the left lung, most evident in the left upper lobe, compatible with pulmonary embolism. Heart size is mildly enlarged. There is no significant pericardial fluid, thickening or pericardial calcification. There  is aortic atherosclerosis, as well as atherosclerosis of the great vessels of the mediastinum and the coronary arteries, including calcified atherosclerotic plaque in the left main, left anterior descending, left circumflex and right coronary arteries. Mediastinum/Nodes: No pathologically enlarged mediastinal or hilar lymph nodes. Esophagus is unremarkable in appearance. No axillary lymphadenopathy. Lungs/Pleura: Linear areas of scarring and/or atelectasis throughout the mid to lower lungs bilaterally. No acute consolidative airspace disease. No pleural effusions. Upper Abdomen: 2.0 x 1.5 cm right adrenal nodule incompletely characterized. Aortic atherosclerosis. Musculoskeletal: There are no aggressive appearing lytic or blastic lesions noted in the visualized portions of the skeleton. Review of the MIP images confirms the above findings. IMPRESSION: 1. Multiple small nonocclusive segmental and subsegmental sized pulmonary emboli in the left lung. 2. Aortic atherosclerosis, in addition to left main and 3 vessel coronary artery disease. Please note that although the presence of coronary artery calcium documents the presence of coronary artery disease, the severity of this disease and any potential stenosis cannot be assessed on this non-gated CT examination. Assessment for potential risk factor modification, dietary therapy or pharmacologic therapy may be warranted, if clinically indicated. 3. 2.0 x 1.5 cm right adrenal nodule, indeterminate. Statistically, this is likely benign. If there is no prior history of cancer, follow-up adrenal protocol CT scan would be recommended in 12 months. Alternatively, if there is a prior history of cancer, adrenal protocol CT in the near future would be recommended. Aortic Atherosclerosis (ICD10-I70.0). Electronically Signed   By: Vinnie Langton M.D.   On: 10/05/2019 17:01   DG Chest Port 1 View  Result Date: 10/05/2019 CLINICAL DATA:  64 year old female with shortness of  breath. EXAM: PORTABLE CHEST 1 VIEW COMPARISON:  None. FINDINGS: Mild chronic bronchitic changes. No focal consolidation, pleural effusion or pneumothorax. Borderline cardiomegaly. Atherosclerotic calcification of the aortic arch. No acute osseous pathology. Thyroidectomy surgical clips. IMPRESSION: No acute cardiopulmonary process. Electronically Signed   By: Anner Crete M.D.   On: 10/05/2019 15:28    Impression: Shortness of breath, elevated D-dimer, several nonocclusive filling defects within segmental and subsegmental size pulmonary artery branches in left lung most evident in left upper lobe compatible with pulmonary embolism  Hemoglobin 7.1 on presentation, 1 unit PRBC transfusion, hemoglobin 8 now Normal BUN/creatinine ratio 13/1.22 and 10/ 0.93 today Iron panel compatible with severe iron deficiency, saturation 3%, ferritin 7, elevated TIBC of 463-has received IV Feraheme 510 mg x 1 dose today FOBT positive Hemodynamically stable, blood pressure 150/73, heart rate 72  SARS Coronavirus 2 NEGATIVE    Plan: No history of melena or hematochezia. Patient has been on Xarelto as an outpatient for history of pulmonary embolism, has been started on IV heparin. No plans for endoscopic intervention at this point. Okay to resume Xarelto, monitor H&H and transfuse if hemoglobin is less than 7. Recommend outpatient work-up for iron deficiency anemia. Patient gets her care at [facility], and as per notes from care everywhere, recommendation was made for colonoscopy as an outpatient. Resume regular diet. Plan to continue PPI twice daily. Recommend CT of the abdomen and pelvis with p.o. and IV contrast to rule out GI pathology which could contribute to anemia.    LOS: 1 day   Ronnette Juniper, MD  10/06/2019, 10:39 AM

## 2019-10-06 NOTE — Progress Notes (Signed)
OT Cancellation Note  Patient Details Name: Loretta Brooks MRN: [id number] DOB: 1956-02-13   Cancelled Treatment:    Reason Eval/Treat Not Completed: Patient not medically ready Pt with new dx PE, started on heparin 1/16 at 1730. OT will hold off at this time and return when pt is medically appropriate and as time allows.   Dorinda Hill OTR/L Acute Rehabilitation Services Office: [place] 10/06/2019, 3:04 PM

## 2019-10-06 NOTE — ED Notes (Signed)
Patient transported to CT 

## 2019-10-06 NOTE — ED Notes (Signed)
Attempted to call report to Volanda Napoleon, RN unavailable and will call back for report.

## 2019-10-06 NOTE — ED Notes (Signed)
Called Patients husband Jacara Mackey and got verbal consent to give blood

## 2019-10-06 NOTE — Progress Notes (Signed)
[facility] PROGRESS NOTE    Progress Note  Loretta Brooks  [id number] DOB: [birth year] DOA: 10/05/2019 PCP: System, Pcp Not In     Brief Narrative:   Loretta Brooks is an 64 y.o. female past medical history of breast cancer in remission, thyroid and neck cancer, diabetes mellitus type 2 COPD oxygen dependent, PE on Xarelto, depression with anxiety comes into the hospital with shortness of breath confusion and poor oral intake that started 3 days prior to admission.  Patient was not able to provide history due to severe hearing impairment.  And most of the history was obtained from the husband.  As per husband she has been having intermittent difficulty breathing that started about 3 days prior to admission.  In the ED she was found to be hypoxic which recovered to 100% with a nonrebreather and wean down to 2 L, with a lactic acid of 4.5, white count of 14 creatinine of 1.2 D-dimer 4 SARS-CoV-2 PCR was negative he was positive, CT angio of the chest with contrast done on admission showed multiple PEs.  Assessment/Plan:   Acute on chronic respiratory failure with hypoxia secondary to acute left lung PE: Patient is currently on 2 L of oxygen and saturations have been greater than 98%, she is no longer tachycardic. CT angio of the chest on admission showed multiple subsegmental nonocclusive pulmonary emboli to the left lung, significant left main and triple-vessel coronary disease. Swallow screen is pending, she has remained afebrile, and her leukocytosis is improving off antibiotics as well as her platelet count. He was started on IV heparin after discussing with GI.  Unsure she had been taking Xarelto at home.  Seizure-like activity: She has no history of seizures, no focal deficit on physical exam. EEG is pending.  Acute metabolic encephalopathy: According to husband she has no history of dementia no focal deficit on physical exam, she does have severe hearing impairment. CT  scan of the head showed no acute findings Encephalopathy likely due to hypoxia.  Occult GI bleed/microcytic anemia: With a positive FOBT. She had a CT angio positive for subsegmental PE, the ED physician spoke with legal GI, she has been type and screen 2 units of packed red blood cells, PT of 15, APTT of 30 platelet count 421, GI recommended to his start anticoagulation, clear liquid diet, IV Protonix and swallowing evaluation. Anemia panel showed ferritin of 7 we will give IV iron.  Acute kidney injury: With a baseline creatinine of less than 1, on admission 1.3 resolved after fluid resuscitation, likely prerenal azotemia.  COPD with hypoxia: Patient denies cough or shortness of breath continue inhalers.  Controlled diabetes mellitus type 2: With an A1c of 5.5, continue sliding scale insulin.   DVT prophylaxis: heparin Family Communication:husband Disposition Plan/Barrier to D/C: She came home from home she will probably go home after she is stabilized from acute pulmonary embolism standpoint and when she has been cleared by GI. Code Status:     Code Status Orders  (From admission, onward)         Start     Ordered   10/06/19 0214  Full code  Continuous     10/06/19 0213        Code Status History    This patient has a current code status but no historical code status.   Advance Care Planning Activity        IV Access:    Peripheral IV   Procedures and diagnostic studies:   CT Head  Wo Contrast  Result Date: 10/05/2019 CLINICAL DATA:  Altered mental status. EXAM: CT HEAD WITHOUT CONTRAST TECHNIQUE: Contiguous axial images were obtained from the base of the skull through the vertex without intravenous contrast. COMPARISON:  Feb 01, 2009 FINDINGS: Brain: No evidence of acute infarction, hemorrhage, hydrocephalus, extra-axial collection or mass lesion/mass effect. 8 mm area of hypoattenuation in the subcortical/periventricular right frontal lobe is stable. Vascular:  No hyperdense vessel or unexpected calcification. Skull: No evidence of fracture. Postsurgical and post reconstruction changes from left maxillary resection. Sinuses/Orbits: The left sphenoid sinus and left mastoid air cells are opacified. Other: None. IMPRESSION: 1. No acute intracranial abnormality. 2. Chronic 8 mm area of hypoattenuation in the subcortical/periventricular right frontal lobe. 3. Opacification of the left sphenoid sinus and left mastoid air cells. Electronically Signed   By: Fidela Salisbury M.D.   On: 10/05/2019 15:31   CT Angio Chest PE W and/or Wo Contrast  Result Date: 10/05/2019 CLINICAL DATA:  64 year old female with history of shortness of breath. Elevated D-dimer. EXAM: CT ANGIOGRAPHY CHEST WITH CONTRAST TECHNIQUE: Multidetector CT imaging of the chest was performed using the standard protocol during bolus administration of intravenous contrast. Multiplanar CT image reconstructions and MIPs were obtained to evaluate the vascular anatomy. CONTRAST:  13mL OMNIPAQUE IOHEXOL 350 MG/ML SOLN COMPARISON:  No priors. FINDINGS: Cardiovascular: Several nonocclusive filling defects are noted within segmental and subsegmental sized pulmonary artery branches in the left lung, most evident in the left upper lobe, compatible with pulmonary embolism. Heart size is mildly enlarged. There is no significant pericardial fluid, thickening or pericardial calcification. There is aortic atherosclerosis, as well as atherosclerosis of the great vessels of the mediastinum and the coronary arteries, including calcified atherosclerotic plaque in the left main, left anterior descending, left circumflex and right coronary arteries. Mediastinum/Nodes: No pathologically enlarged mediastinal or hilar lymph nodes. Esophagus is unremarkable in appearance. No axillary lymphadenopathy. Lungs/Pleura: Linear areas of scarring and/or atelectasis throughout the mid to lower lungs bilaterally. No acute consolidative airspace  disease. No pleural effusions. Upper Abdomen: 2.0 x 1.5 cm right adrenal nodule incompletely characterized. Aortic atherosclerosis. Musculoskeletal: There are no aggressive appearing lytic or blastic lesions noted in the visualized portions of the skeleton. Review of the MIP images confirms the above findings. IMPRESSION: 1. Multiple small nonocclusive segmental and subsegmental sized pulmonary emboli in the left lung. 2. Aortic atherosclerosis, in addition to left main and 3 vessel coronary artery disease. Please note that although the presence of coronary artery calcium documents the presence of coronary artery disease, the severity of this disease and any potential stenosis cannot be assessed on this non-gated CT examination. Assessment for potential risk factor modification, dietary therapy or pharmacologic therapy may be warranted, if clinically indicated. 3. 2.0 x 1.5 cm right adrenal nodule, indeterminate. Statistically, this is likely benign. If there is no prior history of cancer, follow-up adrenal protocol CT scan would be recommended in 12 months. Alternatively, if there is a prior history of cancer, adrenal protocol CT in the near future would be recommended. Aortic Atherosclerosis (ICD10-I70.0). Electronically Signed   By: Vinnie Langton M.D.   On: 10/05/2019 17:01   DG Chest Port 1 View  Result Date: 10/05/2019 CLINICAL DATA:  64 year old female with shortness of breath. EXAM: PORTABLE CHEST 1 VIEW COMPARISON:  None. FINDINGS: Mild chronic bronchitic changes. No focal consolidation, pleural effusion or pneumothorax. Borderline cardiomegaly. Atherosclerotic calcification of the aortic arch. No acute osseous pathology. Thyroidectomy surgical clips. IMPRESSION: No acute cardiopulmonary process. Electronically Signed  By: Anner Crete M.D.   On: 10/05/2019 15:28     Medical Consultants:    None.  Anti-Infectives:   None  Subjective:    Loretta Brooks nonverbal  Objective:     Vitals:   10/06/19 0615 10/06/19 0630 10/06/19 0645 10/06/19 0700  BP: (!) 158/79 (!) 158/84 (!) 156/83 (!) 160/81  Pulse: 73 73 73 72  Resp: (!) 21 (!) 22 19 (!) 22  Temp:      TempSrc:      SpO2: 98% 97% 97% 99%  Weight:      Height:       SpO2: 99 % O2 Flow Rate (L/min): 2 L/min FiO2 (%): 100 %   Intake/Output Summary (Last 24 hours) at 10/06/2019 0722 Last data filed at 10/06/2019 0550 Gross per 24 hour  Intake 2116.79 ml  Output --  Net 2116.79 ml   Filed Weights   10/05/19 1412  Weight: 91.9 kg    Exam: General exam: In no acute distress. Respiratory system: Good air movement and clear to auscultation. Cardiovascular system: S1 & S2 heard, RRR. No JVD. Gastrointestinal system: Abdomen is nondistended, soft and nontender.  Central nervous system: Alert and oriented. No focal neurological deficits. Extremities: No pedal edema. Skin: No rashes, lesions or ulcers  Data Reviewed:    Labs: Basic Metabolic Panel: Recent Labs  Lab 10/05/19 1428 10/06/19 0416  NA 144 143  K 3.8 3.9  CL 108 110  CO2 22 22  GLUCOSE 130* 115*  BUN 13 10  CREATININE 1.22* 0.93  CALCIUM 8.9 8.3*   GFR Estimated Creatinine Clearance: 71.1 mL/min (by C-G formula based on SCr of 0.93 mg/dL). Liver Function Tests: Recent Labs  Lab 10/06/19 0416  AST 15  ALT 9  ALKPHOS 79  BILITOT 0.3  PROT 7.0  ALBUMIN 3.6   No results for input(s): LIPASE, AMYLASE in the last 168 hours. Recent Labs  Lab 10/05/19 1908  AMMONIA 40*   Coagulation profile Recent Labs  Lab 10/05/19 1428  INR 1.2   COVID-19 Labs  Recent Labs    10/05/19 1428 10/05/19 1730  DDIMER 3.91*  --   FERRITIN  --  7*    Lab Results  Component Value Date   SARSCOV2NAA NEGATIVE 10/05/2019    CBC: Recent Labs  Lab 10/05/19 1428 10/06/19 0416  WBC 14.5* 10.9*  HGB 7.1* 7.7*  HCT 27.2* 27.6*  MCV 76.8* 78.9*  PLT 493* 421*   Cardiac Enzymes: No results for input(s): CKTOTAL, CKMB,  CKMBINDEX, TROPONINI in the last 168 hours. BNP (last 3 results) No results for input(s): PROBNP in the last 8760 hours. CBG: Recent Labs  Lab 10/05/19 1434  GLUCAP 136*   D-Dimer: Recent Labs    10/05/19 1428  DDIMER 3.91*   Hgb A1c: Recent Labs    10/06/19 0214  HGBA1C 5.5   Lipid Profile: No results for input(s): CHOL, HDL, LDLCALC, TRIG, CHOLHDL, LDLDIRECT in the last 72 hours. Thyroid function studies: No results for input(s): TSH, T4TOTAL, T3FREE, THYROIDAB in the last 72 hours.  Invalid input(s): FREET3 Anemia work up: Recent Labs    10/05/19 1730 10/05/19 1908  VITAMINB12 813  --   FOLATE 36.8  --   FERRITIN 7*  --   TIBC 463*  --   IRON 13*  --   RETICCTPCT  --  2.3   Sepsis Labs: Recent Labs  Lab 10/05/19 1428 10/05/19 1431 10/05/19 1908 10/06/19 0416  WBC 14.5*  --   --  10.9*  LATICACIDVEN  --  4.5* 1.8  --    Microbiology Recent Results (from the past 240 hour(s))  Blood culture (routine x 2)     Status: None (Preliminary result)   Collection Time: 10/05/19  2:31 PM   Specimen: BLOOD RIGHT HAND  Result Value Ref Range Status   Specimen Description   Final    BLOOD RIGHT HAND Performed at [facility], [place] [address], [place], [place] [address]    Special Requests   Final    BOTTLES DRAWN AEROBIC ONLY Blood Culture adequate volume Performed at [facility] [address] [place], [place] [address]    Culture   Final    NO GROWTH < 12 HOURS Performed at [facility] [address] [place], [place] [address]    Report Status PENDING  Incomplete  Blood culture (routine x 2)     Status: None (Preliminary result)   Collection Time: 10/05/19  2:36 PM   Specimen: BLOOD  Result Value Ref Range Status   Specimen Description   Final    BLOOD LEFT ANTECUBITAL Performed at [facility] [address], [place], [place] [address]    Special Requests   Final    BOTTLES DRAWN AEROBIC ONLY  Blood Culture adequate volume Performed at [facility] [address], [place], [place] [address]    Culture   Final    NO GROWTH < 12 HOURS Performed at [facility] [address] [place], [place] [address]    Report Status PENDING  Incomplete  SARS CORONAVIRUS 2 (TAT 6-24 HRS) Nasopharyngeal Nasopharyngeal Swab     Status: None   Collection Time: 10/05/19  5:15 PM   Specimen: Nasopharyngeal Swab  Result Value Ref Range Status   SARS Coronavirus 2 NEGATIVE NEGATIVE Final    Comment: (NOTE) SARS-CoV-2 target nucleic acids are NOT DETECTED. The SARS-CoV-2 RNA is generally detectable in upper and lower respiratory specimens during the acute phase of infection. Negative results do not preclude SARS-CoV-2 infection, do not rule out co-infections with other pathogens, and should not be used as the sole basis for treatment or other patient management decisions. Negative results must be combined with clinical observations, patient history, and epidemiological information. The expected result is Negative. Fact Sheet for Patients: SugarRoll.be Fact Sheet for Healthcare Providers: https://www.woods-mathews.com/ This test is not yet approved or cleared by the [place] FDA and  has been authorized for detection and/or diagnosis of SARS-CoV-2 by FDA under an Emergency Use Authorization (EUA). This EUA will remain  in effect (meaning this test can be used) for the duration of the COVID-19 declaration under Section 56 4(b)(1) of the Act, 21 U.S.C. section 360bbb-3(b)(1), unless the authorization is terminated or revoked sooner. Performed at [facility], [place] [address], [place],  [address]      Medications:   . docusate sodium  100 mg Oral BID  . insulin aspart  0-15 Units Subcutaneous TID WC  . insulin aspart  0-5 Units Subcutaneous QHS  . ipratropium  2 puff Inhalation Q4H  . mometasone-formoterol   2 puff Inhalation BID  . pantoprazole (PROTONIX) IV  40 mg Intravenous Q12H  . sodium chloride flush  10-40 mL Intracatheter Q12H   Continuous Infusions: . sodium chloride Stopped (10/05/19 1727)  . heparin 1,300 Units/hr (10/06/19 0552)     LOS: 1 day   Charlynne Cousins  [facility]  10/06/2019, 7:22  AM

## 2019-10-06 NOTE — ED Notes (Signed)
Pt was manually fed w/ assistance of EMT.

## 2019-10-06 NOTE — ED Notes (Signed)
Pt given popsicle.

## 2019-10-06 NOTE — Progress Notes (Signed)
ANTICOAGULATION CONSULT NOTE  Pharmacy Consult for Heparin Indication: pulmonary embolus  Not on File  Patient Measurements: Height: 5\' 7"  (170.2 cm) Weight: 202 lb 8 oz (91.9 kg) IBW/kg (Calculated) : 61.6 HEPARIN DW (KG): 81.5  Vital Signs: Temp: 97.9 F (36.6 C) (01/17 0550) Temp Source: Oral (01/17 0550) BP: 162/79 (01/17 1343) Pulse Rate: 64 (01/17 1343)  Labs: Recent Labs    10/05/19 1428 10/05/19 1428 10/06/19 0416 10/06/19 0902  HGB 7.1*   < > 7.7* 8.0*  HCT 27.2*  --  27.6* 28.9*  PLT 493*  --  421*  --   APTT 30  --   --   --   LABPROT 15.3*  --   --   --   INR 1.2  --   --   --   HEPARINUNFRC  --   --  0.23*  --   CREATININE 1.22*  --  0.93  --   TROPONINIHS 3  --   --   --    < > = values in this interval not displayed.    Estimated Creatinine Clearance: 71.1 mL/min (by C-G formula based on SCr of 0.93 mg/dL).   Medical History: History reviewed. No pertinent past medical history.  Medications:  Infusions:  . sodium chloride Stopped (10/05/19 1727)  . heparin 1,300 Units/hr (10/06/19 1230)   No anticoagulation PTA  Assessment: 64 yo F with new dx PE.  Noted patient has a Hg 7.1, guaic +.  Per EDP will proceed with heparin-no bolus.  No overt bleeding noted.  This patient has a hx of PE in 2005 and was on Xarelto at that time.  There is question re: if she is still taking this medication- per outpatient pharmacy refill records she has not filled Xarelto in past 6 months and heparin level is not elevated to also indicate she has not had a dose recently.   10/06/2019:  Heparin level therapeutic (0.4) on heparin infusion at 1300 units/hr  CBC:  Hg 8.0 today after PRBCs, pltc WNL  No bleeding or infusion related issues per nursing  Goal of Therapy:  Heparin level 0.3-0.7 units/ml Monitor platelets by anticoagulation protocol: Yes   Plan:  Continue Heparin infusion at 1300 units/hr Will target lower end of goal range to minimize bleed  risk Recheck heparin level in 6h to confirm heparin at steady state Daily heparin level & CBC while on heparin - currently ordered q6h Monitor closely for s/sx of bleeding  Netta Cedars, PharmD, BCPS 10/06/2019,2:15 PM

## 2019-10-07 ENCOUNTER — Inpatient Hospital Stay (HOSPITAL_COMMUNITY): Payer: Medicare Other

## 2019-10-07 ENCOUNTER — Inpatient Hospital Stay (HOSPITAL_COMMUNITY)
Admit: 2019-10-07 | Discharge: 2019-10-07 | Disposition: A | Discharge status: Home / Self Care | Payer: Medicare Other | Attending: Internal Medicine | Admitting: Internal Medicine

## 2019-10-07 ENCOUNTER — Encounter (HOSPITAL_COMMUNITY): Payer: Self-pay | Admitting: Internal Medicine

## 2019-10-07 ENCOUNTER — Inpatient Hospital Stay: Payer: Self-pay

## 2019-10-07 LAB — CBC
HCT: 29.1 % — ABNORMAL LOW (ref 36.0–46.0)
HCT: 29 % — ABNORMAL LOW (ref 36.0–46.0)
Hemoglobin: 8 g/dL — ABNORMAL LOW (ref 12.0–15.0)
Hemoglobin: 8 g/dL — ABNORMAL LOW (ref 12.0–15.0)
MCH: 21.7 pg — ABNORMAL LOW (ref 26.0–34.0)
MCH: 22 pg — ABNORMAL LOW (ref 26.0–34.0)
MCHC: 27.5 g/dL — ABNORMAL LOW (ref 30.0–36.0)
MCHC: 27.6 g/dL — ABNORMAL LOW (ref 30.0–36.0)
MCV: 79.1 fL — ABNORMAL LOW (ref 80.0–100.0)
MCV: 79.7 fL — ABNORMAL LOW (ref 80.0–100.0)
Platelets: 427 10*3/uL — ABNORMAL HIGH (ref 150–400)
Platelets: 409 10*3/uL — ABNORMAL HIGH (ref 150–400)
RBC: 3.68 MIL/uL — ABNORMAL LOW (ref 3.87–5.11)
RBC: 3.64 MIL/uL — ABNORMAL LOW (ref 3.87–5.11)
RDW: 22.3 % — ABNORMAL HIGH (ref 11.5–15.5)
RDW: 22.3 % — ABNORMAL HIGH (ref 11.5–15.5)
WBC: 10.3 10*3/uL (ref 4.0–10.5)
WBC: 12.3 10*3/uL — ABNORMAL HIGH (ref 4.0–10.5)
nRBC: 0 % (ref 0.0–0.2)
nRBC: 0.2 % (ref 0.0–0.2)

## 2019-10-07 LAB — BASIC METABOLIC PANEL
Anion gap: 18 — ABNORMAL HIGH (ref 5–15)
BUN: 8 mg/dL (ref 8–23)
CO2: 16 mmol/L — ABNORMAL LOW (ref 22–32)
Calcium: 9.1 mg/dL (ref 8.9–10.3)
Chloride: 109 mmol/L (ref 98–111)
Creatinine, Ser: 1.36 mg/dL — ABNORMAL HIGH (ref 0.44–1.00)
GFR calc Af Amer: 48 mL/min — ABNORMAL LOW (ref >60)
GFR calc non Af Amer: 41 mL/min — ABNORMAL LOW (ref >60)
Glucose, Bld: 112 mg/dL — ABNORMAL HIGH (ref 70–99)
Potassium: 3.9 mmol/L (ref 3.5–5.1)
Sodium: 143 mmol/L (ref 135–145)

## 2019-10-07 LAB — GLUCOSE, CAPILLARY
Glucose-Capillary: 103 mg/dL — ABNORMAL HIGH (ref 70–99)
Glucose-Capillary: 115 mg/dL — ABNORMAL HIGH (ref 70–99)
Glucose-Capillary: 131 mg/dL — ABNORMAL HIGH (ref 70–99)
Glucose-Capillary: 133 mg/dL — ABNORMAL HIGH (ref 70–99)
Glucose-Capillary: 156 mg/dL — ABNORMAL HIGH (ref 70–99)
Glucose-Capillary: 90 mg/dL (ref 70–99)
Glucose-Capillary: 93 mg/dL (ref 70–99)
Glucose-Capillary: 98 mg/dL (ref 70–99)

## 2019-10-07 LAB — TYPE AND SCREEN
ABO/RH(D): O POS
Antibody Screen: NEGATIVE
Unit division: 0

## 2019-10-07 LAB — BPAM RBC
Blood Product Expiration Date: 202102122359
ISSUE DATE / TIME: 202101170137
Unit Type and Rh: 5100

## 2019-10-07 LAB — HEPARIN LEVEL (UNFRACTIONATED): Heparin Unfractionated: 0.36 IU/mL (ref 0.30–0.70)

## 2019-10-07 LAB — HEMOGLOBIN AND HEMATOCRIT, BLOOD
HCT: 33.1 % — ABNORMAL LOW (ref 36.0–46.0)
Hemoglobin: 9 g/dL — ABNORMAL LOW (ref 12.0–15.0)

## 2019-10-07 MED ORDER — LEVETIRACETAM 100 MG/ML PO SOLN
1000.0000 mg | Freq: Two times a day (BID) | ORAL | Status: DC
  Filled 2019-10-07: qty 10

## 2019-10-07 MED ORDER — LORAZEPAM 2 MG/ML IJ SOLN
0.5000 mg | Freq: Once | INTRAMUSCULAR | Status: AC
  Administered 2019-10-07: 0.5 mg via INTRAVENOUS
  Filled 2019-10-07: qty 1

## 2019-10-07 MED ORDER — IPRATROPIUM BROMIDE HFA 17 MCG/ACT IN AERS
2.0000 | INHALATION_SPRAY | Freq: Three times a day (TID) | RESPIRATORY_TRACT | Status: DC
  Administered 2019-10-07: 2 via RESPIRATORY_TRACT
  Filled 2019-10-07: qty 12.9

## 2019-10-07 MED ORDER — LEVETIRACETAM IN NACL 500 MG/100ML IV SOLN
500.0000 mg | Freq: Two times a day (BID) | INTRAVENOUS | Status: DC
  Administered 2019-10-07 – 2019-10-11 (×9): 500 mg via INTRAVENOUS
  Filled 2019-10-07 (×9): qty 100

## 2019-10-07 MED ORDER — LORAZEPAM 2 MG/ML IJ SOLN
0.5000 mg | INTRAMUSCULAR | Status: DC | PRN
  Administered 2019-10-07: 0.5 mg via INTRAVENOUS
  Filled 2019-10-07: qty 1

## 2019-10-07 NOTE — Evaluation (Signed)
Physical Therapy Evaluation Patient Details Name: Loretta Brooks MRN: [id number] DOB: 09-16-1956 Today's Date: 10/07/2019   History of Present Illness  64yo female admitted 10/05/19 with SOB, cough, weakness, poor po intake and found to have PE. PMH: breast cancer, thyroid/neck cancer, DM2, COPD, PE, HTN, hypothyroid, HOH, depression/anxiety, chronic debility.  Clinical Impression  Pt admitted with above diagnosis.  Pt currently with functional limitations due to the deficits listed below (see PT Problem List). Pt will benefit from skilled PT to increase their independence and safety with mobility to allow discharge to the venue listed below.  Pt is extremely HOH, but was able to answer some written notes. It seems she walked short distances with family A. Pt with decreased safety and impulsive at times. Recommend HHPT home assessment for safety.     Follow Up Recommendations Home health PT;Supervision for mobility/OOB;Supervision/Assistance - 24 hour    Equipment Recommendations  None recommended by PT    Recommendations for Other Services       Precautions / Restrictions Precautions Precautions: Fall      Mobility  Bed Mobility Overal bed mobility: Needs Assistance Bed Mobility: Supine to Sit;Sit to Supine     Supine to sit: Mod assist Sit to supine: Mod assist      Transfers Overall transfer level: Needs assistance Equipment used: 2 person hand held assist Transfers: Sit to/from Stand Sit to Stand: Min assist;+2 physical assistance;+2 safety/equipment         General transfer comment: tactile and visual cues for standing  Ambulation/Gait Ambulation/Gait assistance: Min assist;+2 physical assistance Gait Distance (Feet): 3 Feet Assistive device: 2 person hand held assist Gait Pattern/deviations: Step-to pattern     General Gait Details: Pt did side-stepping towards [facility] with HHA of 2.  Stairs            Wheelchair Mobility    Modified Rankin  (Stroke Patients Only)       Balance Overall balance assessment: Needs assistance Sitting-balance support: Single extremity supported Sitting balance-Leahy Scale: Fair     Standing balance support: Bilateral upper extremity supported Standing balance-Leahy Scale: Poor                               Pertinent Vitals/Pain Pain Assessment: Faces Pain Score: 3  Faces Pain Scale: Hurts a little bit Pain Location: L side Pain Descriptors / Indicators: Sore Pain Intervention(s): Monitored during session;Repositioned    Home Living Family/patient expects to be discharged to:: Private residence Living Arrangements: Spouse/significant other Available Help at Discharge: Family             Additional Comments: Due to [employer], it was difficult to get info. Used white board, but pt gave inconsistent answers    Prior Function           Comments: Per chart and from some of what pt was saying, seems she ambulates short distances with family help.     Hand Dominance        Extremity/Trunk Assessment   Upper Extremity Assessment Upper Extremity Assessment: Defer to OT evaluation    Lower Extremity Assessment Lower Extremity Assessment: Generalized weakness       Communication   Communication: HOH  Cognition Arousal/Alertness: Awake/alert Behavior During Therapy: WFL for tasks assessed/performed Overall Cognitive Status: No family/caregiver present to determine baseline cognitive functioning Area of Impairment: Safety/judgement  Safety/Judgement: Decreased awareness of safety     General Comments: pt appeared [facility].  Able to read written questions and respond, although sometimes inconsistent answers. Impulsive at times.      General Comments      Exercises     Assessment/Plan    PT Assessment Patient needs continued PT services  PT Problem List Decreased strength;Decreased activity tolerance;Decreased  balance;Decreased mobility;Decreased knowledge of use of DME;Decreased safety awareness       PT Treatment Interventions Gait training;Functional mobility training;Balance training;Therapeutic exercise;Therapeutic activities    PT Goals (Current goals can be found in the Care Plan section)  Acute Rehab PT Goals Patient Stated Goal: home with family PT Goal Formulation: Patient unable to participate in goal setting Time For Goal Achievement: 10/21/19 Potential to Achieve Goals: Good    Frequency Min 3X/week   Barriers to discharge        Co-evaluation PT/OT/SLP Co-Evaluation/Treatment: Yes Reason for Co-Treatment: For patient/therapist safety PT goals addressed during session: Mobility/safety with mobility         AM-PAC PT "6 Clicks" Mobility  Outcome Measure Help needed turning from your back to your side while in a flat bed without using bedrails?: A Little Help needed moving from lying on your back to sitting on the side of a flat bed without using bedrails?: A Little Help needed moving to and from a bed to a chair (including a wheelchair)?: A Little Help needed standing up from a chair using your arms (e.g., wheelchair or bedside chair)?: A Little Help needed to walk in hospital room?: A Little Help needed climbing 3-5 steps with a railing? : A Lot 6 Click Score: 17    End of Session   Activity Tolerance: Patient tolerated treatment well Patient left: in bed;with bed alarm set;with call bell/phone within reach(speech therapy entering) Nurse Communication: Mobility status PT Visit Diagnosis: Unsteadiness on feet (R26.81);Other abnormalities of gait and mobility (R26.89)    Time: [id number] PT Time Calculation (min) (ACUTE ONLY): 21 min   Charges:   PT Evaluation $PT Eval Moderate Complexity: 1 Mod          Loretta Brooks, [place] Pager [phone] 10/07/2019   Galen Manila 10/07/2019, 12:42 PM

## 2019-10-07 NOTE — Progress Notes (Addendum)
[facility] Progress Note  Tylaysia Hong 64 y.o. 07-27-1956  CC: Anemia, pulmonary embolus, abnormal CT scan   Subjective: Recent events noted.  Patient with possible seizure activity earlier in the morning.  Difficult to obtain history from the patient.  Very hard of hearing.  Discussed with RN.  No acute issues at this time.  Patient recently received Ativan.  ROS : Not able to obtain   Objective: Vital signs in last 24 hours: Vitals:   10/06/19 2153 10/07/19 0633  BP: (!) 153/77 (!) 162/91  Pulse: 72 99  Resp: 18 20  Temp: 98 F (36.7 C) 99 F (37.2 C)  SpO2: 90% (!) 86%    Physical Exam:  General.  Alert not able to decide orientation as was difficult to obtain history because of hard of hearing and recently given Ativan. Abdomen.  Soft, nontender, nondistended, bowel sounds present Lower  extremity.  No edema Neuro.  Not able to obtain Lab Results: Recent Labs    10/05/19 1428 10/06/19 0416  NA 144 143  K 3.8 3.9  CL 108 110  CO2 22 22  GLUCOSE 130* 115*  BUN 13 10  CREATININE 1.22* 0.93  CALCIUM 8.9 8.3*   Recent Labs    10/06/19 0416  AST 15  ALT 9  ALKPHOS 79  BILITOT 0.3  PROT 7.0  ALBUMIN 3.6   Recent Labs    10/06/19 1802 10/06/19 2105 10/07/19 0234 10/07/19 0816  WBC 10.7*  --  10.3  --   HGB 8.1*   < > 8.0* 9.0*  HCT 29.2*   < > 29.1* 33.1*  MCV 79.1*  --  79.1*  --   PLT 447*  --  427*  --    < > = values in this interval not displayed.   Recent Labs    10/05/19 1428  LABPROT 15.3*  INR 1.2      Assessment/Plan: -Acute pulmonary embolism.  Currently on heparin drip. -Anemia with abnormal CT scan concerning for masslike thickening of short segment of the distal descending colon as well as concerning nodal metastatic disease along with splenic lesions and lesions on adrenal gland. -Encephalopathy.  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 -Seizure-like activity  Recommendations --------------------- -CT scan findings discussed with patient's husband over the phone. -I do not think she  will be able to follow the  instruction for colon prep. -Also given her history of other cancers, not sure if splenic lesions and adrenal lesions are from colon Metastases or recurrence of the cancer.  -Recommend interventional radiology consult for IR guided biopsy.  This was discussed with patient's husband.  He verbalized understanding.  GI will follow  Otis Brace MD, [place] 10/07/2019, 12:10 PM  Contact #  [phone]

## 2019-10-07 NOTE — Evaluation (Signed)
Occupational Therapy Evaluation Patient Details Name: Loretta Brooks MRN: [id number] DOB: [birth year] Today's Date: 10/07/2019    History of Present Illness 64yo female admitted 10/05/19 with SOB, cough, weakness, poor po intake. PMH: breast cancer, thyroid/neck cancer, DM2, COPD, PE, HTN, hypothyroid, HOH, depression/anxiety, chronic debility.   Clinical Impression   Pt admitted with SOB. Pt currently with functional limitations due to the deficits listed below (see OT Problem List).  Pt will benefit from skilled OT to increase their safety and independence with ADL and functional mobility for ADL to facilitate discharge to venue listed below.      Follow Up Recommendations  Home health OT;Supervision/Assistance - 24 hour    Equipment Recommendations  None recommended by OT    Recommendations for Other Services       Precautions / Restrictions Precautions Precautions: Fall      Mobility Bed Mobility Overal bed mobility: Needs Assistance Bed Mobility: Supine to Sit;Sit to Supine     Supine to sit: Mod assist Sit to supine: Mod assist      Transfers Overall transfer level: Needs assistance Equipment used: 2 person hand held assist Transfers: Sit to/from Stand Sit to Stand: Min assist;+2 physical assistance;+2 safety/equipment         General transfer comment: side stepping to head of bed    Balance Overall balance assessment: Needs assistance Sitting-balance support: Single extremity supported Sitting balance-Leahy Scale: Fair     Standing balance support: Bilateral upper extremity supported Standing balance-Leahy Scale: Poor                             ADL either performed or assessed with clinical judgement   ADL Overall ADL's : Needs assistance/impaired Eating/Feeding: Minimal assistance;Sitting   Grooming: Minimal assistance;Sitting   Upper Body Bathing: Minimal assistance;Sitting   Lower Body Bathing: +2 for safety/equipment;Maximal  assistance;Sit to/from stand;Cueing for safety;Cueing for sequencing   Upper Body Dressing : Sitting   Lower Body Dressing: Sit to/from stand;Maximal assistance;Cueing for safety;Cueing for compensatory techniques;Cueing for sequencing                 General ADL Comments: pt very HOH. Pt able to read words on white board and respond.  Pt followed hand gestures as well     Vision Patient Visual Report: No change from baseline(history of eye surgery.  Plate noted below L eye that was visible)              Pertinent Vitals/Pain Pain Assessment: Faces Pain Score: 3  Faces Pain Scale: Hurts a little bit Pain Location: l side area Pain Descriptors / Indicators: Aching Pain Intervention(s): Monitored during session;Repositioned     Hand Dominance     Extremity/Trunk Assessment Upper Extremity Assessment Upper Extremity Assessment: Generalized weakness           Communication Communication Communication: HOH(pt able to read written words on white board)   Cognition Arousal/Alertness: Awake/alert Behavior During Therapy: WFL for tasks assessed/performed Overall Cognitive Status: No family/caregiver present to determine baseline cognitive functioning                                 General Comments: pt appeared [facility].  Able to read written questions and respond   General Comments               Home Living Family/patient expects to be discharged to:: Unsure Living  Arrangements: Spouse/significant other(Husband)                               Additional Comments: unsure- pt very HOH               OT Problem List: Decreased strength;Decreased range of motion;Decreased activity tolerance;Impaired balance (sitting and/or standing);Decreased safety awareness      OT Treatment/Interventions: Self-care/ADL training;Patient/family education;DME and/or AE instruction    OT Goals(Current goals can be found in the care plan section) Acute  Rehab OT Goals Patient Stated Goal: home with family OT Goal Formulation: With patient Time For Goal Achievement: 10/14/19 Potential to Achieve Goals: Good  OT Frequency: Min 2X/week              AM-PAC OT "6 Clicks" Daily Activity     Outcome Measure Help from another person eating meals?: A Little Help from another person taking care of personal grooming?: A Little Help from another person toileting, which includes using toliet, bedpan, or urinal?: A Little Help from another person bathing (including washing, rinsing, drying)?: A Little Help from another person to put on and taking off regular upper body clothing?: A Little Help from another person to put on and taking off regular lower body clothing?: A Little 6 Click Score: 18   End of Session Nurse Communication: Mobility status  Activity Tolerance: Patient limited by fatigue Patient left: in bed;with call bell/phone within reach  OT Visit Diagnosis: Unsteadiness on feet (R26.81);Other abnormalities of gait and mobility (R26.89);Muscle weakness (generalized) (M62.81);History of falling (Z91.81)                Time: [id number] OT Time Calculation (min): 21 min Charges:  OT General Charges $OT Visit: 1 Visit OT Evaluation $OT Eval Moderate Complexity: 1 Mod  Kari Baars, [place] [phone] Office- [phone], Edwena Felty D 10/07/2019, 12:10 PM

## 2019-10-07 NOTE — Progress Notes (Signed)
ANTICOAGULATION CONSULT NOTE - Follow Up Consult  Pharmacy Consult for Heparin Indication: pulmonary embolus  Not on File  Patient Measurements: Height: 5\' 7"  (170.2 cm) Weight: 202 lb 8 oz (91.9 kg) IBW/kg (Calculated) : 61.6 Heparin Dosing Weight:   Vital Signs: Temp: 99 F (37.2 C) (01/18 0633) Temp Source: Oral (01/18 [id number]) BP: 162/91 (01/18 [id number]) Pulse Rate: 99 (01/18 0633)  Labs: Recent Labs    10/05/19 1428 10/05/19 1428 10/06/19 0416 10/06/19 0902 10/06/19 1411 10/06/19 1411 10/06/19 1802 10/06/19 1802 10/06/19 2105 10/06/19 2105 10/07/19 0234 10/07/19 0816  HGB 7.1*   < > 7.7*   < > 8.2*   < > 8.1*   < > 8.0*   < > 8.0* 9.0*  HCT 27.2*   < > 27.6*   < > 29.0*   < > 29.2*   < > 29.3*  --  29.1* 33.1*  PLT 493*   < > 421*  --   --   --  447*  --   --   --  427*  --   APTT 30  --   --   --   --   --   --   --   --   --   --   --   LABPROT 15.3*  --   --   --   --   --   --   --   --   --   --   --   INR 1.2  --   --   --   --   --   --   --   --   --   --   --   HEPARINUNFRC  --   --  0.23*   < > 0.40  --   --   --  0.37  --   --  0.36  CREATININE 1.22*  --  0.93  --   --   --   --   --   --   --   --   --   TROPONINIHS 3  --   --   --   --   --   --   --   --   --   --   --    < > = values in this interval not displayed.    Estimated Creatinine Clearance: 71.1 mL/min (by C-G formula based on SCr of 0.93 mg/dL).   Medications:  Infusions:  . sodium chloride Stopped (10/05/19 1727)  . heparin 1,300 Units/hr (10/06/19 1230)    Assessment: Today's heparin level is within goal.  No heparin issues noted.  Goal of Therapy:  Heparin level 0.3-0.7 units/ml Monitor platelets by anticoagulation protocol: Yes   Plan:  Continue heparin drip at current rate of 1300 units/hr Monitor daily heparin level, CBC, s/s bleeding  Efraim Kaufmann 10/07/2019,9:40 AM

## 2019-10-07 NOTE — Progress Notes (Signed)
EEG complete - results pending 

## 2019-10-07 NOTE — Progress Notes (Addendum)
[facility] PROGRESS NOTE    Progress Note  Loretta Brooks  [id number] DOB: September 09, 1956 DOA: 10/05/2019 PCP: System, Pcp Not In     Brief Narrative:   Loretta Brooks is an 64 y.o. female past medical history of breast cancer in remission, thyroid and neck cancer, diabetes mellitus type 2 COPD oxygen dependent, PE on Xarelto, depression with anxiety and bedboundcomes into the hospital with shortness of breath confusion and poor oral intake that started 3 days prior to admission.  Patient was not able to provide history due to severe hearing impairment.  And most of the history was obtained from the husband.  As per husband she has been having intermittent difficulty breathing that started about 3 days prior to admission.  In the ED she was found to be hypoxic which recovered to 100% with a nonrebreather and wean down to 2 L, with a lactic acid of 4.5, white count of 14 creatinine of 1.2 D-dimer 4 SARS-CoV-2 PCR was negative he was positive, CT angio of the chest with contrast done on admission showed multiple PEs.  Assessment/Plan:   Acute on chronic respiratory failure with hypoxia secondary to acute left lung PE: She is currently requiring 2 L of oxygen to keep saturations greater than 88%, her tachycardia has resolved. A CT angio of the chest shows multiple subsegmental pulmonary emboli nonocclusive. She is currently on IV heparin, this PE is probably due to malignancy as shown by the CT scan see further details below.  Seizure-like activity: She has no no history of seizures, this morning  I was called in by the nurse by seizure-like activity and again in there afternoon. In the afternoon she was post-ictal She has no focal deficit on physical exam.   MRI brain, EEG, ativan prn, start keppra.  Acute metabolic encephalopathy: She has severe hearing impairment.,  Previous CT of the head showed no acute findings just chronic changes. Encephalopathy could likely be due to her  hypoxia.  Colonic mass/Occult GI bleed/microcytic anemia:: GI to perform colonoscopy as there is a concern of metastatic colon cancer. With an FOBT positive, CT angio of the chest positive for subsegmental PE. GI was consulted who recommended a CT scan of the abdomen that showed a mass like thickening in the distal descending colon surrounded by pericolonic inflammatory changes concerning for malignancy, there were also 2 abnormal mesenteric lymph nodes likely metastatic disease.  There were 2 irregular marginated intermediate lesion within the spleen measuring 3.6 cm concerning for metastatic disease. Ferritin was 7 she received IV iron.  Acute kidney injury: With a baseline creatinine of less than 1, on admission 1.3 resolved after fluid resuscitation, likely prerenal azotemia.  COPD with hypoxia: Patient denies cough or shortness of breath continue inhalers.  Controlled diabetes mellitus type 2: With an A1c of 5.5, continue sliding scale insulin.   DVT prophylaxis: heparin Family Communication:husband Disposition Plan/Barrier to D/C: She came home from home she will probably go home after she is stabilized from acute pulmonary embolism standpoint and when she has been cleared by GI. Code Status:     Code Status Orders  (From admission, onward)         Start     Ordered   10/06/19 0214  Full code  Continuous     10/06/19 0213        Code Status History    This patient has a current code status but no historical code status.   Advance Care Planning Activity  IV Access:    Peripheral IV   Procedures and diagnostic studies:   CT Head Wo Contrast  Result Date: 10/05/2019 CLINICAL DATA:  Altered mental status. EXAM: CT HEAD WITHOUT CONTRAST TECHNIQUE: Contiguous axial images were obtained from the base of the skull through the vertex without intravenous contrast. COMPARISON:  Feb 01, 2009 FINDINGS: Brain: No evidence of acute infarction, hemorrhage,  hydrocephalus, extra-axial collection or mass lesion/mass effect. 8 mm area of hypoattenuation in the subcortical/periventricular right frontal lobe is stable. Vascular: No hyperdense vessel or unexpected calcification. Skull: No evidence of fracture. Postsurgical and post reconstruction changes from left maxillary resection. Sinuses/Orbits: The left sphenoid sinus and left mastoid air cells are opacified. Other: None. IMPRESSION: 1. No acute intracranial abnormality. 2. Chronic 8 mm area of hypoattenuation in the subcortical/periventricular right frontal lobe. 3. Opacification of the left sphenoid sinus and left mastoid air cells. Electronically Signed   By: Fidela Salisbury M.D.   On: 10/05/2019 15:31   CT Angio Chest PE W and/or Wo Contrast  Result Date: 10/05/2019 CLINICAL DATA:  64 year old female with history of shortness of breath. Elevated D-dimer. EXAM: CT ANGIOGRAPHY CHEST WITH CONTRAST TECHNIQUE: Multidetector CT imaging of the chest was performed using the standard protocol during bolus administration of intravenous contrast. Multiplanar CT image reconstructions and MIPs were obtained to evaluate the vascular anatomy. CONTRAST:  111mL OMNIPAQUE IOHEXOL 350 MG/ML SOLN COMPARISON:  No priors. FINDINGS: Cardiovascular: Several nonocclusive filling defects are noted within segmental and subsegmental sized pulmonary artery branches in the left lung, most evident in the left upper lobe, compatible with pulmonary embolism. Heart size is mildly enlarged. There is no significant pericardial fluid, thickening or pericardial calcification. There is aortic atherosclerosis, as well as atherosclerosis of the great vessels of the mediastinum and the coronary arteries, including calcified atherosclerotic plaque in the left main, left anterior descending, left circumflex and right coronary arteries. Mediastinum/Nodes: No pathologically enlarged mediastinal or hilar lymph nodes. Esophagus is unremarkable in  appearance. No axillary lymphadenopathy. Lungs/Pleura: Linear areas of scarring and/or atelectasis throughout the mid to lower lungs bilaterally. No acute consolidative airspace disease. No pleural effusions. Upper Abdomen: 2.0 x 1.5 cm right adrenal nodule incompletely characterized. Aortic atherosclerosis. Musculoskeletal: There are no aggressive appearing lytic or blastic lesions noted in the visualized portions of the skeleton. Review of the MIP images confirms the above findings. IMPRESSION: 1. Multiple small nonocclusive segmental and subsegmental sized pulmonary emboli in the left lung. 2. Aortic atherosclerosis, in addition to left main and 3 vessel coronary artery disease. Please note that although the presence of coronary artery calcium documents the presence of coronary artery disease, the severity of this disease and any potential stenosis cannot be assessed on this non-gated CT examination. Assessment for potential risk factor modification, dietary therapy or pharmacologic therapy may be warranted, if clinically indicated. 3. 2.0 x 1.5 cm right adrenal nodule, indeterminate. Statistically, this is likely benign. If there is no prior history of cancer, follow-up adrenal protocol CT scan would be recommended in 12 months. Alternatively, if there is a prior history of cancer, adrenal protocol CT in the near future would be recommended. Aortic Atherosclerosis (ICD10-I70.0). Electronically Signed   By: Vinnie Langton M.D.   On: 10/05/2019 17:01   CT ABDOMEN PELVIS W CONTRAST  Result Date: 10/06/2019 CLINICAL DATA:  Anemia EXAM: CT ABDOMEN AND PELVIS WITH CONTRAST TECHNIQUE: Multidetector CT imaging of the abdomen and pelvis was performed using the standard protocol following bolus administration of intravenous contrast. CONTRAST:  161mL  OMNIPAQUE IOHEXOL 300 MG/ML  SOLN COMPARISON:  None. FINDINGS: Lower chest: Bibasilar subsegmental atelectasis. Mild cardiomegaly. Coronary artery calcifications.  Hepatobiliary: Liver is unremarkable. No focal liver lesion. Faint hyperdense layering material within the gallbladder lumen suggesting excretion of contrast given recent contrasted study. Pancreas: Rounded 12 mm low-density lesion adjacent to the anterior aspect of the pancreatic tail (series 2, image 26). No peripancreatic inflammatory changes. Single punctate calcification in the pancreatic uncinate process. Spleen: Two irregularly marginated intermediate density lesions within the spleen. The more superiorly oriented lesion measures approximately 2.2 cm while the more inferiorly located lesion measures approximately 3.6 cm. Adrenals/Urinary Tract: Indeterminate density 2.0 x 1.3 cm right adrenal gland nodule. Left adrenal gland is unremarkable. 2.1 cm inferior pole right renal cyst. Left kidney is unremarkable. No solid renal lesion or hydronephrosis. Ureters are nondilated. Urinary bladder is mildly distended with excreted contrast. No evidence of focal bladder wall thickening. Stomach/Bowel: Masslike thickening of a short segment of distal descending colon (series 2, image 49; series 5, image 92) there are surrounding pericolonic inflammatory changes adjacent to this segment. Remainder of the bowel appears within normal limits. No evidence of bowel obstruction. Vascular/Lymphatic: There are 2 rounded mesenteric lymph nodes in the left hemiabdomen, the largest measuring 12 mm short axis (series 2, image 38). Additional nodular densities adjacent to the abnormal segment of colon may reflect additional lymph nodes (series 5, image 90). Aortoiliac atherosclerosis. No aneurysm. Reproductive: Status post hysterectomy. No adnexal masses. Other: Small volume of hyperdense free fluid within the pelvis on the right (series 2, image 61) which may reflect blood products. Musculoskeletal: Multiple degenerative Schmorl's nodes in the lower thoracic and upper lumbar spine. No sinister osseous lesion. No acute osseous  findings. IMPRESSION: 1. Masslike thickening of a short segment of distal descending colon with surrounding pericolonic inflammatory changes. Findings are concerning for primary colon malignancy. 2. There are at least 2 abnormal appearing mesenteric lymph nodes in the left hemiabdomen, suspicious for nodal metastatic disease. 3. Two irregularly marginated intermediate density lesions within the spleen, largest measuring up to 3.6 cm. Findings are concerning for metastasis. 4. Indeterminate density 2.0 cm right adrenal gland nodule, also concerning for metastatic lesion given the above findings. 5. Small volume hyperdense free fluid within the pelvis on the right may reflect blood products. These results will be called to the ordering clinician or representative by the Radiologist Assistant, and communication documented in the PACS or zVision Dashboard. Electronically Signed   By: Davina Poke D.O.   On: 10/06/2019 17:00   DG Chest Port 1 View  Result Date: 10/05/2019 CLINICAL DATA:  64 year old female with shortness of breath. EXAM: PORTABLE CHEST 1 VIEW COMPARISON:  None. FINDINGS: Mild chronic bronchitic changes. No focal consolidation, pleural effusion or pneumothorax. Borderline cardiomegaly. Atherosclerotic calcification of the aortic arch. No acute osseous pathology. Thyroidectomy surgical clips. IMPRESSION: No acute cardiopulmonary process. Electronically Signed   By: Anner Crete M.D.   On: 10/05/2019 15:28     Medical Consultants:    None.  Anti-Infectives:   None  Subjective:    Loretta Brooks has no new complaints.  Objective:    Vitals:   10/06/19 1945 10/06/19 2056 10/06/19 2153 10/07/19 0633  BP: 132/76  (!) 153/77 (!) 162/91  Pulse: 73  72 99  Resp: 18  18 20   Temp:   98 F (36.7 C) 99 F (37.2 C)  TempSrc:   Oral Oral  SpO2: 97% 91% 90% (!) 86%  Weight:  Height:       SpO2: (!) 86 % O2 Flow Rate (L/min): 2.5 L/min FiO2 (%): 100  %   Intake/Output Summary (Last 24 hours) at 10/07/2019 0733 Last data filed at 10/07/2019 0130 Gross per 24 hour  Intake 10 ml  Output --  Net 10 ml   Filed Weights   10/05/19 1412  Weight: 91.9 kg    Exam: General exam: In no acute distress. Respiratory system: Good air movement and clear to auscultation. Cardiovascular system: S1 & S2 heard, RRR. No JVD. Gastrointestinal system: Abdomen is nondistended, soft and nontender.  Central nervous system: Alert and oriented.  Extremities: No pedal edema. Skin: No rashes, lesions or ulcers  Data Reviewed:    Labs: Basic Metabolic Panel: Recent Labs  Lab 10/05/19 1428 10/06/19 0416  NA 144 143  K 3.8 3.9  CL 108 110  CO2 22 22  GLUCOSE 130* 115*  BUN 13 10  CREATININE 1.22* 0.93  CALCIUM 8.9 8.3*   GFR Estimated Creatinine Clearance: 71.1 mL/min (by C-G formula based on SCr of 0.93 mg/dL). Liver Function Tests: Recent Labs  Lab 10/06/19 0416  AST 15  ALT 9  ALKPHOS 79  BILITOT 0.3  PROT 7.0  ALBUMIN 3.6   No results for input(s): LIPASE, AMYLASE in the last 168 hours. Recent Labs  Lab 10/05/19 1908  AMMONIA 40*   Coagulation profile Recent Labs  Lab 10/05/19 1428  INR 1.2   COVID-19 Labs  Recent Labs    10/05/19 1428 10/05/19 1730  DDIMER 3.91*  --   FERRITIN  --  7*    Lab Results  Component Value Date   SARSCOV2NAA NEGATIVE 10/05/2019    CBC: Recent Labs  Lab 10/05/19 1428 10/05/19 1428 10/06/19 0416 10/06/19 0416 10/06/19 0902 10/06/19 1411 10/06/19 1802 10/06/19 2105 10/07/19 0234  WBC 14.5*  --  10.9*  --   --   --  10.7*  --  10.3  HGB 7.1*   < > 7.7*   < > 8.0* 8.2* 8.1* 8.0* 8.0*  HCT 27.2*   < > 27.6*   < > 28.9* 29.0* 29.2* 29.3* 29.1*  MCV 76.8*  --  78.9*  --   --   --  79.1*  --  79.1*  PLT 493*  --  421*  --   --   --  447*  --  427*   < > = values in this interval not displayed.   Cardiac Enzymes: No results for input(s): CKTOTAL, CKMB, CKMBINDEX, TROPONINI in  the last 168 hours. BNP (last 3 results) No results for input(s): PROBNP in the last 8760 hours. CBG: Recent Labs  Lab 10/06/19 1227 10/06/19 1742 10/07/19 0051 10/07/19 0433 10/07/19 0656  GLUCAP 95 90 98 156* 133*   D-Dimer: Recent Labs    10/05/19 1428  DDIMER 3.91*   Hgb A1c: Recent Labs    10/06/19 0214  HGBA1C 5.5   Lipid Profile: No results for input(s): CHOL, HDL, LDLCALC, TRIG, CHOLHDL, LDLDIRECT in the last 72 hours. Thyroid function studies: No results for input(s): TSH, T4TOTAL, T3FREE, THYROIDAB in the last 72 hours.  Invalid input(s): FREET3 Anemia work up: Recent Labs    10/05/19 1730 10/05/19 1908  VITAMINB12 813  --   FOLATE 36.8  --   FERRITIN 7*  --   TIBC 463*  --   IRON 13*  --   RETICCTPCT  --  2.3   Sepsis Labs: Recent Labs  Lab 10/05/19 1428  10/05/19 1431 10/05/19 1908 10/06/19 0416 10/06/19 1802 10/07/19 0234  WBC 14.5*  --   --  10.9* 10.7* 10.3  LATICACIDVEN  --  4.5* 1.8  --   --   --    Microbiology Recent Results (from the past 240 hour(s))  Blood culture (routine x 2)     Status: None (Preliminary result)   Collection Time: 10/05/19  2:31 PM   Specimen: BLOOD RIGHT HAND  Result Value Ref Range Status   Specimen Description   Final    BLOOD RIGHT HAND Performed at [facility], [place] [address] [place], [place] [address]    Special Requests   Final    BOTTLES DRAWN AEROBIC ONLY Blood Culture adequate volume Performed at [facility] [address], [place], [place] [address]    Culture   Final    NO GROWTH < 12 HOURS Performed at [facility] [address] [place], [place] [address]    Report Status PENDING  Incomplete  Blood culture (routine x 2)     Status: None (Preliminary result)   Collection Time: 10/05/19  2:36 PM   Specimen: BLOOD  Result Value Ref Range Status   Specimen Description   Final    BLOOD LEFT ANTECUBITAL Performed at [facility] [address], [place], [place] [address]    Special Requests   Final    BOTTLES DRAWN AEROBIC ONLY Blood Culture adequate volume Performed at [facility] [address] [place], [place] [address]    Culture   Final    NO GROWTH < 12 HOURS Performed at [facility] [address] [place], [place] [address]    Report Status PENDING  Incomplete  SARS CORONAVIRUS 2 (TAT 6-24 HRS) Nasopharyngeal Nasopharyngeal Swab     Status: None   Collection Time: 10/05/19  5:15 PM   Specimen: Nasopharyngeal Swab  Result Value Ref Range Status   SARS Coronavirus 2 NEGATIVE NEGATIVE Final    Comment: (NOTE) SARS-CoV-2 target nucleic acids are NOT DETECTED. The SARS-CoV-2 RNA is generally detectable in upper and lower respiratory specimens during the acute phase of infection. Negative results do not preclude SARS-CoV-2 infection, do not rule out co-infections with other pathogens, and should not be used as the sole basis for treatment or other patient management decisions. Negative results must be combined with clinical observations, patient history, and epidemiological information. The expected result is Negative. Fact Sheet for Patients: SugarRoll.be Fact Sheet for Healthcare Providers: https://www.woods-mathews.com/ This test is not yet approved or cleared by the [place] FDA and  has been authorized for detection and/or diagnosis of SARS-CoV-2 by FDA under an Emergency Use Authorization (EUA). This EUA will remain  in effect (meaning this test can be used) for the duration of the COVID-19 declaration under Section 56 4(b)(1) of the Act, 21 U.S.C. section 360bbb-3(b)(1), unless the authorization is terminated or revoked sooner. Performed at [facility], [place] [address], [place],  [address]      Medications:   . docusate sodium  100 mg Oral BID  . insulin aspart  0-15 Units Subcutaneous TID WC  . insulin  aspart  0-5 Units Subcutaneous QHS  . ipratropium  2 puff Inhalation QID  . levETIRAcetam  1,000 mg Oral BID  . mometasone-formoterol  2 puff Inhalation BID  . pantoprazole (PROTONIX) IV  40 mg Intravenous Q12H  . sodium chloride flush  10-40 mL Intracatheter Q12H  Continuous Infusions: . sodium chloride Stopped (10/05/19 1727)  . heparin 1,300 Units/hr (10/06/19 1230)     LOS: 2 days   Charlynne Cousins  [facility]  10/07/2019, 7:33 AM

## 2019-10-07 NOTE — Significant Event (Signed)
Rapid Response Event Note  Overview: Time Called: [id number] Arrival Time: [id number] Event Type: Neurologic, Cardiac  Initial Focused Assessment:  Called to bedside due to seizure like activity with unresponsiveness. (Previous episode this morning as well.) Patient had episode of agitation attempting to get out of bed then had apparent seizure. Code Blue called but patient never lost a pulse or became apnic. On RRT arrival patient no longer having seizure like activity but appears post ictal. Patient non-responsive to sternal rub or verbal stimuli. Pupils reactive but doesn't blink to physical threat. MD called and updated, plan for MRI, EEG, and Keppra. Around 1330 patient slowly became more alert but was agitated and not re-directable, just grunting and swatting at staff. Attempted second IV site unable to obtain. Plan to pause Heparin gtt long enough to run Keppra. Pharmacy notified. Lab at bedside sticking for new lab work. Husband at bedside as well. Patient on 5L [place] O2 98 throughout the event. Has no known history of seizures. Sinus tachycardic in the 130s throughout. MD to come to bedside for evaluation. Heart rate and respirations returning back to baseline as patient becomes more alert. MD at bedside discussing goals of care with patient and husband. Patient now speaking and attempting to participate in goals of care conversation but has lingering confusion.     Plan of Care (if not transferred):  EEG  MRI Lab work Keppra infusion Patient to move to a room closer to nurses station on unit. PICC placement  Event Summary: Name of Physician Notified: Aileen Fass, A. MD at 1320    at    Outcome: Other (Comment)  Event End Time: [place]

## 2019-10-07 NOTE — Evaluation (Addendum)
Clinical/Bedside Swallow Evaluation Patient Details  Name: Loretta Brooks MRN: [id number] Date of Birth: 05-Oct-1955  Today's Date: 10/07/2019 Time: SLP Start Time (ACUTE ONLY): 1100 SLP Stop Time (ACUTE ONLY): 1140 SLP Time Calculation (min) (ACUTE ONLY): 40 min  Past Medical History: History reviewed. No pertinent past medical history. Past Surgical History: History reviewed. No pertinent surgical history. HPI:  64yo female admitted 10/05/19 with SOB, cough, weakness, poor po intake. PMH: breast cancer, thyroid/neck cancer, DM2, COPD, PE, HTN, hypothyroid, HOH, depression/anxiety, chronic debility.   Assessment / Plan / Recommendation Clinical Impression  Pt presents with surgically altered left orofacial area due to partial resection of the maxilla. Speech is intelligible despite structural changes and being edentulous. Pt accepted trials of thin liquid, puree, and solid textures, and appeared to tolerate all consistencies without obvious oral deficit or overt s/s aspiration. Recommend advancing diet to regular solids with thin liquids, meds per pt preference. No further ST intervention is recommended at this time. Please reconsult if needs arise.   SLP Visit Diagnosis: Dysphagia, unspecified (R13.10)    Aspiration Risk  Mild aspiration risk    Diet Recommendation Regular;Thin liquid   Liquid Administration via: Cup;Straw Medication Administration: Whole meds with liquid Supervision: Patient able to self feed Compensations: Slow rate;Small sips/bites Postural Changes: Seated upright at 90 degrees    Other  Recommendations Oral Care Recommendations: Oral care BID Other Recommendations: Clarify dietary restrictions   Follow up Recommendations None          Prognosis Prognosis for Safe Diet Advancement: Good      Swallow Study   General Date of Onset: 10/05/19 HPI: 64yo female admitted 10/05/19 with SOB, cough, weakness, poor po intake. PMH: breast cancer, thyroid/neck  cancer, DM2, COPD, PE, HTN, hypothyroid, HOH, depression/anxiety, chronic debility. Type of Study: Bedside Swallow Evaluation Previous Swallow Assessment: none Diet Prior to this Study: Other (Comment)(clear liquids) Temperature Spikes Noted: No Respiratory Status: Nasal cannula History of Recent Intubation: No Behavior/Cognition: Alert;Cooperative;Pleasant mood;Other (Comment)(VERY hard of hearing) Oral Cavity Assessment: Within Functional Limits Oral Care Completed by SLP: No Oral Cavity - Dentition: Edentulous Vision: Functional for self-feeding Self-Feeding Abilities: Able to feed self Patient Positioning: Upright in bed Baseline Vocal Quality: Normal Volitional Cough: Strong Volitional Swallow: Able to elicit    Oral/Motor/Sensory Function Overall Oral Motor/Sensory Function: Mild impairment Facial ROM: Reduced left Facial Symmetry: Abnormal symmetry left Facial Strength: Reduced left Facial Sensation: Reduced left Lingual ROM: Within Functional Limits Lingual Symmetry: Within Functional Limits Lingual Strength: Within Functional Limits Lingual Sensation: Within Functional Limits Velum: Within Functional Limits Mandible: Within Functional Limits   Ice Chips Ice chips: Not tested   Thin Liquid Thin Liquid: Within functional limits Presentation: Cup;Straw    Nectar Thick Nectar Thick Liquid: Not tested   Honey Thick Honey Thick Liquid: Not tested   Puree Puree: Within functional limits Presentation: Self Fed;Spoon   Solid     Solid: Within functional limits Presentation: [place] B. Quentin Ore, [facility], [place] Speech Language Pathologist Office: [phone] Pager: [phone]  Shonna Chock 10/07/2019,11:46 AM

## 2019-10-07 NOTE — Progress Notes (Signed)
Patient had been resting comfortably since EEG complete. Suddenly awake, has pulled out midline IV, no bleeding noted, pressure held. Attempting to get out of bed, difficult to redirect. Tech unable to leave bedside d/t unsafe situation. Will treat and monitor.

## 2019-10-07 NOTE — Progress Notes (Signed)
[id number]: Rapid response call for possible seizure activity. Patient responsive to painful stimuli when this writer entered the room. Primary nurse reports that patient had an episode of pulselessness and breathlessness, per protocol the Code team was called. No CPR or medications were given to this patient. Patient's  Pulse  122 per monitor Sinus tach  and respirations  24 and with a requirement of increased oxygen demands at 3 l [place]   0700 Notified Dr. Olevia Bowens of situation.  Patient is alert to self and place at this time, she is not postictal , she did not lose control of bladder or bowel with this episode.    30 Dr. Olevia Bowens at bedside with new orders given. Patient to remain on [place] at present time.    [facility] Tyleek Smick MSN, RN-BC  [place]

## 2019-10-07 NOTE — Progress Notes (Signed)
ANTICOAGULATION CONSULT NOTE - Follow Up Consult  Pharmacy Consult for Heparin Indication: pulmonary embolus  Not on File  Patient Measurements: Height: 5\' 7"  (170.2 cm) Weight: 202 lb 8 oz (91.9 kg) IBW/kg (Calculated) : 61.6 Heparin Dosing Weight:   Vital Signs: Temp: 98 F (36.7 C) (01/17 2153) Temp Source: Oral (01/17 2153) BP: 153/77 (01/17 2153) Pulse Rate: 72 (01/17 2153)  Labs: Recent Labs    10/05/19 1428 10/05/19 1428 10/06/19 0416 10/06/19 0902 10/06/19 1411 10/06/19 1411 10/06/19 1802 10/06/19 1802 10/06/19 2105 10/07/19 0234  HGB 7.1*   < > 7.7*   < > 8.2*   < > 8.1*   < > 8.0* 8.0*  HCT 27.2*   < > 27.6*   < > 29.0*   < > 29.2*  --  29.3* 29.1*  PLT 493*   < > 421*  --   --   --  447*  --   --  427*  APTT 30  --   --   --   --   --   --   --   --   --   LABPROT 15.3*  --   --   --   --   --   --   --   --   --   INR 1.2  --   --   --   --   --   --   --   --   --   HEPARINUNFRC  --   --  0.23*  --  0.40  --   --   --  0.37  --   CREATININE 1.22*  --  0.93  --   --   --   --   --   --   --   TROPONINIHS 3  --   --   --   --   --   --   --   --   --    < > = values in this interval not displayed.    Estimated Creatinine Clearance: 71.1 mL/min (by C-G formula based on SCr of 0.93 mg/dL).   Medications:  Infusions:  . sodium chloride Stopped (10/05/19 1727)  . heparin 1,300 Units/hr (10/06/19 1230)    Assessment: Patient with heparin level at goal.  No heparin issues noted.  Goal of Therapy:  Heparin level 0.3-0.7 units/ml Monitor platelets by anticoagulation protocol: Yes   Plan:  Continue heparin drip at current rate Recheck level with AM labs  Nani Skillern Crowford 10/07/2019,3:21 AM

## 2019-10-08 DIAGNOSIS — R569 Unspecified convulsions: Secondary | ICD-10-CM

## 2019-10-08 DIAGNOSIS — G9341 Metabolic encephalopathy: Secondary | ICD-10-CM

## 2019-10-08 LAB — CBC
HCT: 26.7 % — ABNORMAL LOW (ref 36.0–46.0)
HCT: 28.7 % — ABNORMAL LOW (ref 36.0–46.0)
Hemoglobin: 7.7 g/dL — ABNORMAL LOW (ref 12.0–15.0)
Hemoglobin: 8 g/dL — ABNORMAL LOW (ref 12.0–15.0)
MCH: 22.1 pg — ABNORMAL LOW (ref 26.0–34.0)
MCH: 22.3 pg — ABNORMAL LOW (ref 26.0–34.0)
MCHC: 28.8 g/dL — ABNORMAL LOW (ref 30.0–36.0)
MCHC: 27.9 g/dL — ABNORMAL LOW (ref 30.0–36.0)
MCV: 76.5 fL — ABNORMAL LOW (ref 80.0–100.0)
MCV: 80.2 fL (ref 80.0–100.0)
Platelets: 460 10*3/uL — ABNORMAL HIGH (ref 150–400)
Platelets: 447 10*3/uL — ABNORMAL HIGH (ref 150–400)
RBC: 3.49 MIL/uL — ABNORMAL LOW (ref 3.87–5.11)
RBC: 3.58 MIL/uL — ABNORMAL LOW (ref 3.87–5.11)
RDW: 22.5 % — ABNORMAL HIGH (ref 11.5–15.5)
RDW: 22.7 % — ABNORMAL HIGH (ref 11.5–15.5)
WBC: 10.2 10*3/uL (ref 4.0–10.5)
WBC: 10.4 10*3/uL (ref 4.0–10.5)
nRBC: 0.3 % — ABNORMAL HIGH (ref 0.0–0.2)
nRBC: 0.5 % — ABNORMAL HIGH (ref 0.0–0.2)

## 2019-10-08 LAB — CEA: CEA: 5.5 ng/mL — ABNORMAL HIGH (ref 0.0–4.7)

## 2019-10-08 LAB — GLUCOSE, CAPILLARY
Glucose-Capillary: 84 mg/dL (ref 70–99)
Glucose-Capillary: 96 mg/dL (ref 70–99)
Glucose-Capillary: 86 mg/dL (ref 70–99)
Glucose-Capillary: 86 mg/dL (ref 70–99)

## 2019-10-08 LAB — HEPARIN LEVEL (UNFRACTIONATED): Heparin Unfractionated: 0.36 IU/mL (ref 0.30–0.70)

## 2019-10-08 MED ORDER — ALBUTEROL SULFATE (2.5 MG/3ML) 0.083% IN NEBU: 2.5000 mg | INHALATION_SOLUTION | RESPIRATORY_TRACT | Status: DC | PRN

## 2019-10-08 MED ORDER — SODIUM CHLORIDE 0.9% FLUSH: 10.0000 mL | INTRAVENOUS | Status: DC | PRN

## 2019-10-08 MED ORDER — LORAZEPAM 2 MG/ML IJ SOLN
1.0000 mg | Freq: Once | INTRAMUSCULAR | Status: AC
  Administered 2019-10-08: 1 mg via INTRAVENOUS
  Filled 2019-10-08: qty 1

## 2019-10-08 NOTE — Progress Notes (Signed)
[facility] Gastroenterology Progress Note  Loretta Brooks 64 y.o. 03-04-1956  CC: Anemia, pulmonary embolus, abnormal CT scan   Subjective: Yesterday's events noted.  Patient more confused today.  Not able to obtain any history.  ROS : Not able to obtain   Objective: Vital signs in last 24 hours: Vitals:   10/07/19 2053 10/08/19 0656  BP:  (!) 142/69  Pulse:  74  Resp:  20  Temp:    SpO2: 96% 94%    Physical Exam:  General.  Confused.  Not able to obtain any history  Abdomen.  Soft, nontender, nondistended, bowel sounds present Neuro.  Not able to obtain Lab Results: Recent Labs    10/06/19 0416 10/07/19 1335  NA 143 143  K 3.9 3.9  CL 110 109  CO2 22 16*  GLUCOSE 115* 112*  BUN 10 8  CREATININE 0.93 1.36*  CALCIUM 8.3* 9.1   Recent Labs    10/06/19 0416  AST 15  ALT 9  ALKPHOS 79  BILITOT 0.3  PROT 7.0  ALBUMIN 3.6   Recent Labs    10/07/19 1724 10/08/19 0554  WBC 12.3* 10.2  HGB 8.0* 7.7*  HCT 29.0* 26.7*  MCV 79.7* 76.5*  PLT 409* 460*   Recent Labs    10/05/19 1428  LABPROT 15.3*  INR 1.2      Assessment/Plan: -Acute pulmonary embolism.  Currently on heparin drip. -Anemia with abnormal CT scan concerning for masslike thickening of short segment of the distal descending colon as well as concerning nodal metastatic disease along with splenic lesions and lesions on adrenal gland. -Encephalopathy.  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 -Seizure-like activity  Recommendations --------------------- - lesions seen on the CT scan are not amenable for IR guided biopsy per Dr. Kathlene Cote.  - She will need colonoscopy or flexible sigmoidoscopy once other acute issues are resolved.  -GI will follow periodically.  Otis Brace MD, [place] 10/08/2019, 9:02 AM  Contact #  [phone]

## 2019-10-08 NOTE — Plan of Care (Signed)

## 2019-10-08 NOTE — Care Management Important Message (Signed)
Important Message  Patient Details IM Letter given to [name] SW Case Manager to present to the Patient Name: Loretta Brooks MRN: [id number] Date of Birth: 1956-04-15   Medicare Important Message Given:  Yes     Kerin Salen 10/08/2019, 2:29 PM

## 2019-10-08 NOTE — Progress Notes (Signed)
ANTICOAGULATION CONSULT NOTE - Follow Up Consult  Pharmacy Consult for Heparin Indication: pulmonary embolus  Not on File  Patient Measurements: Height: 5\' 7"  (170.2 cm) Weight: 202 lb 8 oz (91.9 kg) IBW/kg (Calculated) : 61.6 Heparin Dosing Weight: 82 kg  Vital Signs: Temp: 97.6 F (36.4 C) (01/18 2048) Temp Source: Oral (01/18 2048) BP: 142/69 (01/19 0656) Pulse Rate: 74 (01/19 0656)  Labs: Recent Labs    10/05/19 1428 10/05/19 1428 10/06/19 0416 10/06/19 0902 10/06/19 1802 10/06/19 2105 10/07/19 0234 10/07/19 0234 10/07/19 0816 10/07/19 0816 10/07/19 1335 10/07/19 1724 10/08/19 0554  HGB 7.1*   < > 7.7*   < >   < > 8.0* 8.0*   < > 9.0*   < >  --  8.0* 7.7*  HCT 27.2*   < > 27.6*   < >   < > 29.3* 29.1*   < > 33.1*  --   --  29.0* 26.7*  PLT 493*   < > 421*   < >   < >  --  427*  --   --   --   --  409* 460*  APTT 30  --   --   --   --   --   --   --   --   --   --   --   --   LABPROT 15.3*  --   --   --   --   --   --   --   --   --   --   --   --   INR 1.2  --   --   --   --   --   --   --   --   --   --   --   --   HEPARINUNFRC  --   --  0.23*   < >  --  0.37  --   --  0.36  --   --   --  0.36  CREATININE 1.22*  --  0.93  --   --   --   --   --   --   --  1.36*  --   --   TROPONINIHS 3  --   --   --   --   --   --   --   --   --   --   --   --    < > = values in this interval not displayed.    Estimated Creatinine Clearance: 48.6 mL/min (A) (by C-G formula based on SCr of 1.36 mg/dL (H)).  Assessment: Pharmacy consulted to dose/monitor heparin infusion in this 64 year old female diagnosed with acute PE. Pt was not on anticoagulants PTA.  Today, 10/08/19  HL = 0.36 remains therapeutic on heparin infusion of 1300 units/hr  Confirmed with RN that heparin infusing at correct rate. No signs/symptoms of bleeding.   CBC: Hgb (7.7) remains low but stable. Plt elevated, stable.   GI note on 1/18 recommends IR consult for biopsy.  Goal of Therapy:  Heparin  level 0.3-0.7 units/ml Monitor platelets by anticoagulation protocol: Yes   Plan:   Continue heparin infusion at current rate of 1300 units/hr  Daily HL and CBC  Monitor for signs/symptoms of bleeding  Follow along for eventual transition to PO anticoagulation  If pt undergoes IR guided biopsy, IR to provide guidance on when to hold heparin infusion prior to procedure  [facility]  Shelda Jakes, PharmD 10/08/19 8:40 AM

## 2019-10-08 NOTE — Procedures (Signed)
Patient Name: Loretta Brooks  MRN: [id number]  Epilepsy Attending: Lora Havens  Referring Physician/Provider: Dr. Marguarite Arbour Date: 10/07/2019 Duration: 25.37 minutes  Patient history: 64 year old female who was admitted for acute hypoxic failure and altered mental status.  She was noted to have seizure-like activityX2.  EEG ordered to evaluate for altered mental status and seizures.  Level of alertness: Awake  AEDs during EEG study: Keppra  Technical aspects: This EEG study was done with scalp electrodes positioned according to the 10-20 International system of electrode placement. Electrical activity was acquired at a sampling rate of 500Hz  and reviewed with a high frequency filter of 70Hz  and a low frequency filter of 1Hz . EEG data were recorded continuously and digitally stored.   Description:The posterior dominant rhythm consists of 8-9 Hz activity of moderate voltage (25-35 uV) seen predominantly in posterior head regions, symmetric and reactive to eye opening and eye closing.  Hyperventilation and photic stimulation were not performed.  Of note, EEG was technically difficult due to constant movement artifact.  IMPRESSION: This technically difficult study is within normal limits. No seizures or epileptiform discharges were seen throughout the recording.  However, only wakefulness was recorded. If suspicion for interictal activity remains a concern, a prolonged study including sleep can be considered.   Loretta Brooks [name]

## 2019-10-08 NOTE — Progress Notes (Signed)
PROGRESS NOTE  Loretta Brooks [id number] DOB: [birth year] DOA: 10/05/2019 PCP: System, Pcp Not In  Brief History   Loretta Brooks is an 64 y.o. female past medical history of breast cancer in remission, thyroid and neck cancer, diabetes mellitus type 2 COPD oxygen dependent, PE on Xarelto, depression with anxiety and bedboundcomes into the hospital with shortness of breath confusion and poor oral intake that started 3 days prior to admission.  Patient was not able to provide history due to severe hearing impairment.  And most of the history was obtained from the husband.  As per husband she has been having intermittent difficulty breathing that started about 3 days prior to admission.  In the ED she was found to be hypoxic which recovered to 100% with a nonrebreather and wean down to 2 L, with a lactic acid of 4.5, white count of 14 creatinine of 1.2 D-dimer 4 SARS-CoV-2 PCR was negative he was positive, CT angio of the chest with contrast done on admission showed multiple PEs.  CT abdomen and pelvis was performed on 10/06/2019. It demonstrated mass-like thickening of a short segment of distal descending colon with surrounding pericolonic inflammatory changes. GI has been consulted and will take the patient for colonoscopy when she is more stable. This study also demonstrated 2 or more abnormal appearing mesenteric lymph nodes in the left hemi-abdomen, a 2.0 cm right adrenal gland nodule and well as 2 lesions in the spleen all of which are concerning for metastatic disease. CEA is elevated at 5.5.   [facility] were consutled to admit the patient for further evaluation and care.  The patient has been admitted to a telemetry bed. She has had seizure like activity on 10/06/2019 with a post ictal period later that day. She has been started on keppra. MRI brain demonstrated no acute abnormality. EEG was performed. It was within normal limits. Prolonged study including sleep can be  considered as this study was done only during wakefulness.  Consultants  . Gastroenterology  Procedures  . None  Antibiotics   Anti-infectives (From admission, onward)   Start     Dose/Rate Route Frequency Ordered Stop   10/05/19 1600  ceFEPIme (MAXIPIME) 2 g in sodium chloride 0.9 % 100 mL IVPB     2 g 200 mL/hr over 30 Minutes Intravenous  Once 10/05/19 1548 10/05/19 1654   10/05/19 1545  vancomycin (VANCOCIN) IVPB 1000 mg/200 mL premix     1,000 mg 200 mL/hr over 60 Minutes Intravenous  Once 10/05/19 1533 10/05/19 1819   10/05/19 1545  ceFEPIme (MAXIPIME) 1 g in sodium chloride 0.9 % 100 mL IVPB  Status:  Discontinued     1 g 200 mL/hr over 30 Minutes Intravenous  Once 10/05/19 1533 10/05/19 1548    .  Subjective  The patient is resting quietly. No new complaints.   Objective   Vitals:  Vitals:   10/08/19 0656 10/08/19 1343  BP: (!) 142/69 (!) 146/54  Pulse: 74 72  Resp: 20 17  Temp:    SpO2: 94% 98%   Exam:  Constitutional:  . The patient is talking to people who are not present. She is confused and speaks tangentially. She is agitated and confused.  Respiratory:  . No increased work of breathing. . No wheezes, rales, or rhonchi . No tactile fremitus Cardiovascular:  . Regular rate and rhythm . No murmurs, ectopy, or gallups. . No lateral PMI. No thrills. Abdomen:  . Abdomen is soft, non-tender, non-distended . No hernias,  masses, or organomegaly . Normoactive bowel sounds.  Musculoskeletal:  . No cyanosis, clubbing, or edema Skin:  . No rashes, lesions, ulcers . palpation of skin: no induration or nodules Neurologic:  . Pt is confused and agitated. She is unable to cooperate with a neurological evaluation. Psychiatric:  . The patient is confused and agitated. She is unable to cooperate with a neurological evaluation.  I have personally reviewed the following:   Today's Data  . Vitals, CBC, CEA, BMP (10/07/2019)  Micro Data  . Blood cultures  (10/05/2019): No growth  Imaging  . CTA chest, CT abdomen and pelvis, CT head, CXR  Scheduled Meds: . docusate sodium  100 mg Oral BID  . insulin aspart  0-15 Units Subcutaneous TID WC  . insulin aspart  0-5 Units Subcutaneous QHS  . ipratropium  2 puff Inhalation TID  . mometasone-formoterol  2 puff Inhalation BID  . pantoprazole (PROTONIX) IV  40 mg Intravenous Q12H  . sodium chloride flush  10-40 mL Intracatheter Q12H   Continuous Infusions: . sodium chloride Stopped (10/05/19 1727)  . heparin 1,300 Units/hr (10/08/19 0603)  . levETIRAcetam 500 mg (10/08/19 1327)    Active Problems:   Acute on chronic respiratory failure with hypoxia (HCC)   Acute pulmonary embolism (HCC)   Acute metabolic encephalopathy   Microcytic anemia   Uncontrolled type 2 diabetes mellitus with hyperglycemia (HCC)   COPD without exacerbation ([place])   Essential hypertension   LOS: 3 days   A & P  Acute on chronic respiratory failure with hypoxia secondary to acute left lung PE: She is currently requiring 3 L of oxygen to keep saturations greater than 94%. Her tachycardia has resolved. A CT angio of the chest shows multiple subsegmental pulmonary emboli nonocclusive. She is currently on IV heparin, this PE is probably due to malignancy as shown by the CT scan see further details below.  Seizure-like activity: She has no no history of seizures. However, this morning  I was called in by the nurse by seizure-like activity and again in there afternoon. In the afternoon she was post-ictal She has no focal deficit on physical exam.  MRI and EEG were unremarkable. She was started on keppra. She has ativan prn as needed for seizure activity. Consider sleeping or prolonged EEG.  Acute metabolic encephalopathy: She has severe hearing impairment.,  Previous CT of the head showed no acute findings just chronic changes. Encephalopathy could likely be due to her hypoxia.  Colonic mass/Occult GI bleed/microcytic  anemia: GI to perform colonoscopy when the patient is stable due to the appearance of a potential malignancy in the colon as well as conscern for d there is a concern of metastatic colon cancer. With an FOBT positive, CT angio of the chest positive for subsegmental PE. GI was consulted who recommended a CT scan of the abdomen that showed a mass like thickening in the distal descending colon surrounded by pericolonic inflammatory changes concerning for malignancy, there were also 2 abnormal mesenteric lymph nodes likely metastatic disease.  There were 2 irregular marginated intermediate lesion within the spleen measuring 3.6 cm concerning for metastatic disease. Ferritin was 7 she received IV iron.  Acute kidney injury: With a baseline creatinine of less than 1, on admission 1.3, and 1.36 today. Monitor.  COPD with hypoxia: Patient denies cough or shortness of breath continue inhalers.  Controlled diabetes mellitus type 2: With an A1c of 5.5, continue sliding scale insulin. She is on a heart healthy carb modified diet and  a hypoglycemic protocol.   DVT prophylaxis: heparin Family Communication:husband Disposition Plan/Barrier to D/C: She came home from home she will probably go home after she is stabilized from acute pulmonary embolism standpoint and when she has been cleared by GI. Code Status:  Iosefa Weintraub, DO [facility] Direct contact: see [email or web address]  7PM-7AM contact night coverage as above 10/08/2019, 1:48 PM  LOS: 3 days

## 2019-10-09 LAB — BASIC METABOLIC PANEL
Anion gap: 11 (ref 5–15)
BUN: 11 mg/dL (ref 8–23)
CO2: 24 mmol/L (ref 22–32)
Calcium: 9.2 mg/dL (ref 8.9–10.3)
Chloride: 108 mmol/L (ref 98–111)
Creatinine, Ser: 1.05 mg/dL — ABNORMAL HIGH (ref 0.44–1.00)
GFR calc Af Amer: >60 mL/min (ref >60)
GFR calc non Af Amer: 56 mL/min — ABNORMAL LOW (ref >60)
Glucose, Bld: 102 mg/dL — ABNORMAL HIGH (ref 70–99)
Potassium: 3.9 mmol/L (ref 3.5–5.1)
Sodium: 143 mmol/L (ref 135–145)

## 2019-10-09 LAB — HEPARIN LEVEL (UNFRACTIONATED)
Heparin Unfractionated: 0.26 IU/mL — ABNORMAL LOW (ref 0.30–0.70)
Heparin Unfractionated: 0.45 IU/mL (ref 0.30–0.70)
Heparin Unfractionated: 0.56 IU/mL (ref 0.30–0.70)

## 2019-10-09 LAB — CBC
HCT: 32.3 % — ABNORMAL LOW (ref 36.0–46.0)
HCT: 30.9 % — ABNORMAL LOW (ref 36.0–46.0)
Hemoglobin: 8.9 g/dL — ABNORMAL LOW (ref 12.0–15.0)
Hemoglobin: 8.6 g/dL — ABNORMAL LOW (ref 12.0–15.0)
MCH: 22.4 pg — ABNORMAL LOW (ref 26.0–34.0)
MCH: 22.7 pg — ABNORMAL LOW (ref 26.0–34.0)
MCHC: 27.6 g/dL — ABNORMAL LOW (ref 30.0–36.0)
MCHC: 27.8 g/dL — ABNORMAL LOW (ref 30.0–36.0)
MCV: 81.2 fL (ref 80.0–100.0)
MCV: 81.5 fL (ref 80.0–100.0)
Platelets: 477 10*3/uL — ABNORMAL HIGH (ref 150–400)
Platelets: 461 10*3/uL — ABNORMAL HIGH (ref 150–400)
RBC: 3.98 MIL/uL (ref 3.87–5.11)
RBC: 3.79 MIL/uL — ABNORMAL LOW (ref 3.87–5.11)
RDW: 23.2 % — ABNORMAL HIGH (ref 11.5–15.5)
RDW: 23.6 % — ABNORMAL HIGH (ref 11.5–15.5)
WBC: 12.3 10*3/uL — ABNORMAL HIGH (ref 4.0–10.5)
WBC: 11.6 10*3/uL — ABNORMAL HIGH (ref 4.0–10.5)
nRBC: 0.3 % — ABNORMAL HIGH (ref 0.0–0.2)
nRBC: 0.2 % (ref 0.0–0.2)

## 2019-10-09 LAB — GLUCOSE, CAPILLARY
Glucose-Capillary: 91 mg/dL (ref 70–99)
Glucose-Capillary: 85 mg/dL (ref 70–99)
Glucose-Capillary: 92 mg/dL (ref 70–99)
Glucose-Capillary: 96 mg/dL (ref 70–99)

## 2019-10-09 MED ORDER — HEPARIN (PORCINE) 25000 UT/250ML-% IV SOLN
1500.0000 [IU]/h | INTRAVENOUS | Status: DC
  Administered 2019-10-09: 22:00:00 1500 [IU]/h via INTRAVENOUS
  Filled 2019-10-09 (×3): qty 250

## 2019-10-09 MED ORDER — OLANZAPINE 5 MG PO TBDP
5.0000 mg | ORAL_TABLET | Freq: Every day | ORAL | Status: DC
  Administered 2019-10-09: 5 mg via ORAL
  Filled 2019-10-09 (×2): qty 1

## 2019-10-09 NOTE — Consult Note (Signed)
Consultation Note Date: 10/09/2019   Patient Name: Loretta Brooks  DOB: Sep 24, 1955  MRN: [id number]  Age / Sex: 64 y.o., female  PCP: System, Pcp Not In Referring Physician: Karie Kirks, DO  Reason for Consultation: Establishing goals of care  HPI/Patient Profile: 64 y.o. female   admitted on 10/05/2019    Clinical Assessment and Goals of Care: 64 year old lady with history of breast cancer, thyroid and neck cancer.  Also has diabetes COPD depression and anxiety.  At baseline is with bedbound status and lives at home.  Admitted with shortness of breath.  Found to have pulmonary embolism.  Also found to have mass like thickening distal descending colon.  Also found to have abnormal appearing mesenteric lymph nodes, right adrenal gland nodule, lesions in the spleen all concerning for metastatic disease along with elevated CEA level.  Hospital course complicated by possible seizure type activity, ongoing encephalopathy.  Palliative medicine consultation for goals of care discussions has been requested.  Patient remains admitted to hospital medicine service.  She is being followed by GI specialist.  Ms. Sailer is speaking loudly even though nobody is in the room.  She does not engage.  She does not follow commands.  She is wearing mittens.  Not able to eat much, not able to participate much with physical therapy.  I met with patient's husband Mr. Penix at the bedside.  A family meeting was held.  Introduced scope of palliative medicine and discussions on goals of care as follows: Palliative medicine is specialized medical care for people living with serious illness. It focuses on providing relief from the symptoms and stress of a serious illness. The goal is to improve quality of life for both the patient and the family.  Goals of care: Broad aims of medical therapy in relation to the patient's values and  preferences. Our aim is to provide medical care aimed at enabling patients to achieve the goals that matter most to them, given the circumstances of their particular medical situation and their constraints.   Discussed frankly but compassionately that patient remains at high risk for ongoing decline and decompensation.  Discussed that the patient does not appear to be making any meaningful recovery at this point.  Discussed extensively about full code: CPR, ACLS protocol, use of mechanical ventilation after intubation.  Shared concern that this might cause more harm and discomfort and suffering.  Alternatively, scope of DO NOT RESUSCITATE discussed and explained in detail.  Patient's husband asked that I give their daughter Ms. Lewis a call and explain all of the above.  Call placed and discussed with Ms. Lewis as well.  Discussed about CODE STATUS, discussed about patient's current hospitalization, discussed about next steps including hospice services.  See below.  Thank you for the consult.  NEXT OF KIN Lives at home with husband.  They have three children.  SUMMARY OF RECOMMENDATIONS   Full code for now.  Have had extensive discussions with patient's husband at the bedside as well as separately on a phone call with  the patient's daughter Ms. Lewis about full code versus DO NOT RESUSCITATE.  Offered medical recommendations for establishing DO NOT RESUSCITATE/DO NOT INTUBATE given the patient's current condition and high risk for ongoing decline and decompensation.  Family wishes to discuss amongst themselves.  PMT to follow-up.  Continue current mode of care for now.  Briefly discussed with patient's husband Mr. Apperson in a face-to-face family meeting about appropriateness of addition of hospice services, specifically the type of care that can be provided in a residential hospice setting such as [facility] placed also discussed with him to some extent.  He wishes to discuss further with his children.   PMT to follow-up. Thank you for the consult Code Status/Advance Care Planning:  Full code    Symptom Management:   Continue current mode of care  Palliative Prophylaxis:   Delirium Protocol  Additional Recommendations (Limitations, Scope, Preferences):    Psycho-social/Spiritual:   Desire for further Chaplaincy support:yes  Additional Recommendations: Education on Hospice  Prognosis:   Unable to determine  Discharge Planning: To Be Determined      Primary Diagnoses: Present on Admission: . Acute on chronic respiratory failure with hypoxia ([place])   I have reviewed the medical record, interviewed the patient and family, and examined the patient. The following aspects are pertinent.  History reviewed. No pertinent past medical history. Social History   Socioeconomic History  . Marital status: Married    Spouse name: Not on file  . Number of children: Not on file  . Years of education: Not on file  . Highest education level: Not on file  Occupational History  . Not on file  Tobacco Use  . Smoking status: Former Smoker    Types: Cigarettes  Substance and Sexual Activity  . Alcohol use: Not on file  . Drug use: Not on file  . Sexual activity: Not on file  Other Topics Concern  . Not on file  Social History Narrative  . Not on file   Social Determinants of Health   Financial Resource Strain:   . Difficulty of Paying Living Expenses: Not on file  Food Insecurity:   . Worried About Charity fundraiser in the Last Year: Not on file  . Ran Out of Food in the Last Year: Not on file  Transportation Needs:   . Lack of Transportation (Medical): Not on file  . Lack of Transportation (Non-Medical): Not on file  Physical Activity:   . Days of Exercise per Week: Not on file  . Minutes of Exercise per Session: Not on file  Stress:   . Feeling of Stress : Not on file  Social Connections:   . Frequency of Communication with Friends and Family: Not on file  .  Frequency of Social Gatherings with Friends and Family: Not on file  . Attends Religious Services: Not on file  . Active Member of Clubs or Organizations: Not on file  . Attends Archivist Meetings: Not on file  . Marital Status: Not on file   History reviewed. No pertinent family history. Scheduled Meds: . docusate sodium  100 mg Oral BID  . insulin aspart  0-15 Units Subcutaneous TID WC  . insulin aspart  0-5 Units Subcutaneous QHS  . mometasone-formoterol  2 puff Inhalation BID  . OLANZapine zydis  5 mg Oral Daily  . pantoprazole (PROTONIX) IV  40 mg Intravenous Q12H  . sodium chloride flush  10-40 mL Intracatheter Q12H   Continuous Infusions: . sodium chloride Stopped (10/05/19  1727)  . heparin 1,500 Units/hr (10/09/19 0641)  . levETIRAcetam 500 mg (10/09/19 1415)   PRN Meds:.acetaminophen **OR** acetaminophen, albuterol, bisacodyl, labetalol, ondansetron **OR** ondansetron (ZOFRAN) IV, senna-docusate, sodium chloride flush, sodium chloride flush, sodium phosphate Medications Prior to Admission:  Prior to Admission medications   Medication Sig Start Date End Date Taking? Authorizing Provider  Accu-Chek FastClix Lancets MISC 1 each by Other route daily. 08/21/19   [provider]  ACCU-CHEK GUIDE test strip 1 each by Other route daily. 09/12/19   [provider]  amLODipine (NORVASC) 10 MG tablet Take 10 mg by mouth daily. LF 12.17.2020 #90 09/05/19   [provider]  baclofen (LIORESAL) 10 MG tablet Take 10 mg by mouth 3 (three) times daily. LF 12.21.2020 #270 09/09/19   [provider]  escitalopram (LEXAPRO) 20 MG tablet Take 20 mg by mouth daily. LF 12.22.2020 #90 09/10/19   [provider]  hydrOXYzine (ATARAX/VISTARIL) 25 MG tablet Take 25 mg by mouth 2 (two) times daily as needed. LF 12.18.2020 #60 09/06/19   [provider]  levothyroxine (SYNTHROID) 200 MCG tablet Take 200 mcg by mouth daily. LF 10.21.2020 #90  07/10/19   [provider]  metFORMIN (GLUCOPHAGE) 500 MG tablet Take 500 mg by mouth 2 (two) times daily. LF 12.3.2020 #180 08/22/19   [provider]  methocarbamol (ROBAXIN) 500 MG tablet Take 500 mg by mouth 2 (two) times daily as needed for pain. LF 8.25.2020 #60 05/14/19   [provider]  metoprolol succinate (TOPROL-XL) 25 MG 24 hr tablet Take 25 mg by mouth 2 (two) times daily. LF 10.25.2020 #180 07/14/19   [provider]  sulfamethoxazole-trimethoprim (BACTRIM DS) 800-160 MG tablet Take 1 tablet by mouth daily. Last Filled 12.31.2020 #90 09/19/19   [provider]  tiZANidine (ZANAFLEX) 4 MG tablet Take 4 mg by mouth 2 (two) times daily as needed for pain. LF 9.3.2020 #180 05/23/19   [provider]  zolpidem (AMBIEN CR) 12.5 MG CR tablet Take 12.5 mg by mouth at bedtime as needed for sleep. LF 12.31.2020 #30 09/19/19   [provider]   Not on File Review of Systems Patient is nonverbal.  Appears confused and agitated Physical Exam Patient is talking loudly even when no one is present in the room.  She is confused. She is wearing mittens. Regular work of breathing S1-S2 Abdomen is nondistended nontender Does not follow commands opens eyes but does not engage Has generalized edema  Vital Signs: BP (!) 152/68   Pulse 82   Temp 97.6 F (36.4 C) (Axillary)   Resp 20   Ht _0  (1.702 m)   Wt 91.9 kg   SpO2 100%   BMI 31.72 kg/m  Pain Scale: Faces   Pain Score: 0-No pain   SpO2: SpO2: 100 % O2 Device:SpO2: 100 % O2 Flow Rate: .O2 Flow Rate (L/min): 3 L/min  IO: Intake/output summary:   Intake/Output Summary (Last 24 hours) at 10/09/2019 1452 Last data filed at 10/09/2019 2956 Gross per 24 hour  Intake 473.92 ml  Output --  Net 473.92 ml    LBM: Last BM Date: (Unknown. Pt was unable to state last BM. ) Baseline Weight: Weight: 91.9 kg Most recent weight: Weight: 91.9 kg     Palliative  Assessment/Data:   PPS 10%  Time In:  1300 Time Out:  1410 Time Total:  70  Greater than 50%  of this time was spent counseling and coordinating care related to the  above assessment and plan.  Signed by: Loistine Chance, MD   Please contact Palliative Medicine Team phone at [phone] for questions and concerns.  For individual provider: See Shea Evans

## 2019-10-09 NOTE — Progress Notes (Signed)
ANTICOAGULATION CONSULT NOTE - Follow Up Consult  Pharmacy Consult for Heparin Indication: pulmonary embolus  Not on File  Patient Measurements: Height: 5\' 7"  (170.2 cm) Weight: 202 lb 8 oz (91.9 kg) IBW/kg (Calculated) : 61.6 Heparin Dosing Weight: 82 kg  Vital Signs: BP: 152/68 (01/20 0811) Pulse Rate: 82 (01/20 0811)  Labs: Recent Labs    10/07/19 1335 10/07/19 1724 10/08/19 0554 10/08/19 0554 10/08/19 1637 10/09/19 0531 10/09/19 1321  HGB  --    < > 7.7*   < > 8.0* 8.9*  --   HCT  --    < > 26.7*  --  28.7* 32.3*  --   PLT  --    < > 460*  --  447* 477*  --   HEPARINUNFRC  --   --  0.36  --   --  0.26* 0.45  CREATININE 1.36*  --   --   --   --  1.05*  --    < > = values in this interval not displayed.    Estimated Creatinine Clearance: 63 mL/min (A) (by C-G formula based on SCr of 1.05 mg/dL (H)).  Assessment: Pharmacy consulted to dose/monitor heparin infusion in this 64 year old female diagnosed with acute PE. Pt was not on anticoagulants PTA.  Today, 10/09/19  HL = 0.45 is therapeutic on heparin infusion of 1500 units/hr  Confirmed with RN that heparin infusing at correct rate. No signs/symptoms of bleeding.   CBC: Hgb (8.9) remains low but stable. Plt elevated, stable.   Goal of Therapy:  Heparin level 0.3-0.7 units/ml Monitor platelets by anticoagulation protocol: Yes   Plan:   Continue heparin infusion at current rate of 1500 units/hr  Confirmatory HL in 6 hours  Daily HL, CBC  Monitor for signs/symptoms of bleeding  Follow along for eventual transition to PO anticoagulation  Lenis Noon, PharmD 10/09/19 1:55 PM

## 2019-10-09 NOTE — Progress Notes (Addendum)
ANTICOAGULATION CONSULT NOTE - Follow Up Consult  Pharmacy Consult for Heparin Indication: pulmonary embolus  Not on File  Patient Measurements: Height: 5\' 7"  (170.2 cm) Weight: 202 lb 8 oz (91.9 kg) IBW/kg (Calculated) : 61.6 Heparin Dosing Weight: 82 kg  Vital Signs: Temp: 97.6 F (36.4 C) (01/19 2341) Temp Source: Axillary (01/19 2341) BP: 148/71 (01/20 0605) Pulse Rate: 79 (01/20 0605)  Labs: Recent Labs    10/07/19 0816 10/07/19 1335 10/07/19 1724 10/08/19 0554 10/08/19 0554 10/08/19 1637 10/09/19 0531  HGB 9.0*  --    < > 7.7*   < > 8.0* 8.9*  HCT 33.1*  --    < > 26.7*  --  28.7* 32.3*  PLT  --   --    < > 460*  --  447* 477*  HEPARINUNFRC 0.36  --   --  0.36  --   --  0.26*  CREATININE  --  1.36*  --   --   --   --  1.05*   < > = values in this interval not displayed.    Estimated Creatinine Clearance: 63 mL/min (A) (by C-G formula based on SCr of 1.05 mg/dL (H)).  Assessment: Pharmacy consulted to dose/monitor heparin infusion in this 64 year old female diagnosed with acute PE. Pt was not on anticoagulants PTA.   1/19  HL = 0.36 remains therapeutic on heparin infusion of 1300 units/hr  Confirmed with RN that heparin infusing at correct rate. No signs/symptoms of bleeding.   CBC: Hgb (7.7) remains low but stable. Plt elevated, stable. Today, 1/20  0531 HL = 0.26 below goal. RN confirms no bleeding noted but patient did pull IV out ~0130, but was restarted within 5 mins. H/H = 8.9/32.3, plts = 477  GI note on 1/18 recommends IR consult for biopsy.  Goal of Therapy:  Heparin level 0.3-0.7 units/ml Monitor platelets by anticoagulation protocol: Yes   Plan:   Increase heparin drip to 1500 units/hr  Daily HL and CBC  Monitor for signs/symptoms of bleeding  Follow along for eventual transition to PO anticoagulation  If pt undergoes IR guided biopsy, IR to provide guidance on when to hold heparin infusion prior to procedure  Dorrene German, PharmD 10/09/19 6:36 AM

## 2019-10-09 NOTE — Progress Notes (Signed)
Patient has been restless and agitated, Patient has on mittens and was still abe to remove RFA IV, there is no bleeding noted and IV fluids changed to second IV site. MD on call notified and order was placed for one time dose of Ativan. Patient is stable and will continue to be monitored. Dawson Bills, RN

## 2019-10-09 NOTE — Progress Notes (Signed)
PROGRESS NOTE  Loretta Brooks [id number] DOB: [birth year] DOA: 10/05/2019 PCP: System, Pcp Not In  Brief History   Loretta Brooks is an 64 y.o. female past medical history of breast cancer in remission, thyroid and neck cancer, diabetes mellitus type 2 COPD oxygen dependent, PE on Xarelto, depression with anxiety and bedboundcomes into the hospital with shortness of breath confusion and poor oral intake that started 3 days prior to admission.  Patient was not able to provide history due to severe hearing impairment.  And most of the history was obtained from the husband.  As per husband she has been having intermittent difficulty breathing that started about 3 days prior to admission.  In the ED she was found to be hypoxic which recovered to 100% with a nonrebreather and wean down to 2 L, with a lactic acid of 4.5, white count of 14 creatinine of 1.2 D-dimer 4 SARS-CoV-2 PCR was negative he was positive, CT angio of the chest with contrast done on admission showed multiple PEs.  CT abdomen and pelvis was performed on 10/06/2019. It demonstrated mass-like thickening of a short segment of distal descending colon with surrounding pericolonic inflammatory changes. GI has been consulted and will take the patient for colonoscopy when she is more stable. This study also demonstrated 2 or more abnormal appearing mesenteric lymph nodes in the left hemi-abdomen, a 2.0 cm right adrenal gland nodule and well as 2 lesions in the spleen all of which are concerning for metastatic disease. CEA is elevated at 5.5.   [facility] were consutled to admit the patient for further evaluation and care.  The patient has been admitted to a telemetry bed. She has had seizure like activity on 10/06/2019 with a post ictal period later that day. She has been started on keppra. MRI brain demonstrated no acute abnormality. EEG was performed. It was within normal limits. Prolonged study including sleep can be  considered as this study was done only during wakefulness.  The patient has been very agitated and confused for the last 4-5 days. Family reports that the patient was also confused, agitated, and behaving in an unusual manner prior to admission. Certainly that has been exacerbated by sedating medications such as ativan and oxycodone, an unfamiliar surrounding, and her acute illness. She has been placed on a delirium protocol. Ativan and oxycodone have been stopped. She has been given Zyprexa today. We will also try ambulation.  Consultants  . Gastroenterology  Procedures  . None  Antibiotics   Anti-infectives (From admission, onward)   Start     Dose/Rate Route Frequency Ordered Stop   10/05/19 1600  ceFEPIme (MAXIPIME) 2 g in sodium chloride 0.9 % 100 mL IVPB     2 g 200 mL/hr over 30 Minutes Intravenous  Once 10/05/19 1548 10/05/19 1654   10/05/19 1545  vancomycin (VANCOCIN) IVPB 1000 mg/200 mL premix     1,000 mg 200 mL/hr over 60 Minutes Intravenous  Once 10/05/19 1533 10/05/19 1819   10/05/19 1545  ceFEPIme (MAXIPIME) 1 g in sodium chloride 0.9 % 100 mL IVPB  Status:  Discontinued     1 g 200 mL/hr over 30 Minutes Intravenous  Once 10/05/19 1533 10/05/19 1548     Subjective  The patient is again having a conversation with people who are not in her room. She is very confused.   Objective   Vitals:  Vitals:   10/09/19 0811 10/09/19 1119  BP: (!) 152/68   Pulse: 82   Resp: 20  Temp:    SpO2: 100% 100%   Exam:  Constitutional:  . The patient is talking to people who are not present. She is confused and speaks tangentially. She is agitated and confused.  Respiratory:  . No increased work of breathing. . No wheezes, rales, or rhonchi . No tactile fremitus Cardiovascular:  . Regular rate and rhythm . No murmurs, ectopy, or gallups. . No lateral PMI. No thrills. Abdomen:  . Abdomen is soft, non-tender, non-distended . No hernias, masses, or organomegaly .  Normoactive bowel sounds.  Musculoskeletal:  . No cyanosis, clubbing, or edema Skin:  . No rashes, lesions, ulcers . palpation of skin: no induration or nodules Neurologic:  . Pt is confused and agitated. She is unable to cooperate with a neurological evaluation. Psychiatric:  . The patient is confused and agitated. She is unable to cooperate with a neurological evaluation.  I have personally reviewed the following:   Today's Data  . Vitals, CBC, BMP (10/07/2019), Glucoses  Micro Data  . Blood cultures (10/05/2019): No growth  Imaging  . CTA chest, CT abdomen and pelvis, CT head, CXR  Scheduled Meds: . docusate sodium  100 mg Oral BID  . insulin aspart  0-15 Units Subcutaneous TID WC  . insulin aspart  0-5 Units Subcutaneous QHS  . mometasone-formoterol  2 puff Inhalation BID  . OLANZapine zydis  5 mg Oral Daily  . pantoprazole (PROTONIX) IV  40 mg Intravenous Q12H  . sodium chloride flush  10-40 mL Intracatheter Q12H   Continuous Infusions: . sodium chloride Stopped (10/05/19 1727)  . heparin 1,500 Units/hr (10/09/19 0641)  . levETIRAcetam 500 mg (10/09/19 1415)    Active Problems:   Acute on chronic respiratory failure with hypoxia ([facility])   Acute pulmonary embolism ([facility])   Acute metabolic encephalopathy   Microcytic anemia   Uncontrolled type 2 diabetes mellitus with hyperglycemia ([facility])   COPD without exacerbation ([facility])   Essential hypertension   LOS: 4 days   A & P  Acute on chronic respiratory failure with hypoxia secondary to acute left lung PE: She is currently requiring 3 L of oxygen to keep saturations greater than 94%. Her tachycardia has resolved. A CT angio of the chest shows multiple subsegmental pulmonary emboli nonocclusive. She is currently on IV heparin, this PE is probably due to malignancy as shown by the CT scan see further details below.  Seizure-like activity: She has no no history of seizures. However, this morning  I was called in by the nurse  by seizure-like activity and again in there afternoon. In the afternoon she was post-ictal She has no focal deficit on physical exam.  MRI and EEG were unremarkable. She was started on keppra. She has ativan prn as needed for seizure activity. Consider sleeping or prolonged EEG.  Delirium: The patient has been very agitated and confused for the last 4-5 days. Family reports that the patient was also confused, agitated, and behaving in an unusual manner prior to admission. Certainly that has been exacerbated by sedating medications such as ativan and oxycodone, an unfamiliar surrounding, and her acute illness. She has been placed on a delirium protocol. Ativan and oxycodone have been stopped. She has been given Zyprexa today. We will also try ambulation. Certainly returning her to home and to familiar surroundings is a priority.  Acute metabolic encephalopathy: She has severe hearing impairment.,  Previous CT of the head showed no acute findings just chronic changes. Encephalopathy could likely be due to her hypoxia.  Colonic mass/Occult GI bleed/microcytic anemia: GI to perform colonoscopy when the patient is stable due to the appearance of a potential malignancy in the colon as well as conscern for d there is a concern of metastatic colon cancer.With an FOBT positive, CT angio of the chest positive for subsegmental PE.GI was consulted who recommended a CT scan of the abdomen that showed a mass like thickening in the distal descending colon surrounded by pericolonic inflammatory changes concerning for malignancy, there were also 2 abnormal mesenteric lymph nodes likely metastatic disease.  There were 2 irregular marginated intermediate lesion within the spleen measuring 3.6 cm concerning for metastatic disease. Ferritin was 7 she received IV iron.  Acute kidney injury: With a baseline creatinine of less than 1, on admission 1.3, and 1.36 today. Monitor.  COPD with hypoxia: Patient denies cough or  shortness of breath continue inhalers.  Controlled diabetes mellitus type 2: With an A1c of 5.5, continue sliding scale insulin. She is on a heart healthy carb modified diet and a hypoglycemic protocol.   DVT prophylaxis: heparin Family Communication: I have discussed the patient in detail with her husband and her daughter Loretta Brooks on 10/08/2019. All questions answered to the best of my ability. Disposition Plan/Barrier to D/C: She came home from home she will probably go home after she is stabilized from acute pulmonary embolism standpoint and when she has been cleared by GI. Code Status: Full Code Loretta Cuttino, DO [facility] Direct contact: see [email or web address]  7PM-7AM contact night coverage as above 10/09/2019, 2:38 PM  LOS: 3 days

## 2019-10-09 NOTE — Progress Notes (Signed)
Pt is very agitated and restless, climbing in the bed constantly. Unable to redirect. Pt pulled her PIV also. MD is paged. Pt needs constant redirection. Awaiting further instructions.

## 2019-10-09 NOTE — Progress Notes (Signed)
ANTICOAGULATION CONSULT NOTE - Follow Up Consult  Pharmacy Consult for Heparin Indication: pulmonary embolus  Not on File  Patient Measurements: Height: 5\' 7"  (170.2 cm) Weight: 202 lb 8 oz (91.9 kg) IBW/kg (Calculated) : 61.6 Heparin Dosing Weight: 82 kg  Vital Signs: Temp: 97.5 F (36.4 C) (01/20 1457) BP: 187/93 (01/20 1503) Pulse Rate: 86 (01/20 1457)  Labs: Recent Labs    10/07/19 1335 10/07/19 1724 10/08/19 0554 10/08/19 1637 10/09/19 0531 10/09/19 1321 10/09/19 1717 10/09/19 1942  HGB  --    < >   < > 8.0* 8.9*  --  8.6*  --   HCT  --    < >  --  28.7* 32.3*  --  30.9*  --   PLT  --    < >  --  447* 477*  --  461*  --   HEPARINUNFRC  --    < >   < >  --  0.26* 0.45  --  0.56  CREATININE 1.36*  --   --   --  1.05*  --   --   --    < > = values in this interval not displayed.    Estimated Creatinine Clearance: 63 mL/min (A) (by C-G formula based on SCr of 1.05 mg/dL (H)).  Assessment: Pharmacy consulted to dose/monitor heparin infusion in this 64 year old female diagnosed with acute PE. Pt was not on anticoagulants PTA.  Today, 10/09/2019:  Confirmatory heparin level remains therapeutic on 1500 units/hr  No bleeding or infusion issues per RN  CBC: Hgb remains low but stable. Plt elevated, stable.  Goal of Therapy:  Heparin level 0.3-0.7 units/ml Monitor platelets by anticoagulation protocol: Yes   Plan:   Continue heparin at 1500 units/hr  Daily HL and CBC  Monitor for signs/symptoms of bleeding  Follow along for eventual transition to PO anticoagulation  Lanijah Warzecha A, PharmD 10/09/19 9:40 PM

## 2019-10-09 NOTE — Progress Notes (Signed)
Occupational Therapy Treatment Patient Details Name: Loretta Brooks MRN: [id number] DOB: 1956-07-11 Today's Date: 10/09/2019    History of present illness 64yo female admitted 10/05/19 with SOB, cough, weakness, poor po intake and found to have PE. PMH: breast cancer, thyroid/neck cancer, DM2, COPD, PE, HTN, hypothyroid, HOH, depression/anxiety, chronic debility.   OT comments  Pt agitated this day. Pt not following commands (gestures or written)  Follow Up Recommendations  Supervision/Assistance - 24 hour;SNF    Equipment Recommendations  None recommended by OT       Precautions / Restrictions Precautions Precautions: Fall       Mobility Bed Mobility Overal bed mobility: Needs Assistance Bed Mobility: Supine to Sit;Sit to Supine     Supine to sit: Max assist Sit to supine: Max assist   General bed mobility comments: pt became agitated and CNA Aed OT return pt to supine.  Transfers                      Balance Overall balance assessment: Needs assistance Sitting-balance support: Single extremity supported Sitting balance-Leahy Scale: Poor                                     ADL either performed or assessed with clinical judgement   ADL Overall ADL's : Needs assistance/impaired     Grooming: Wash/dry face;Maximal assistance                                 General ADL Comments: pt not following gestures this day or written instruction.  Pt much much more confused this day.  RN aware.     Vision Patient Visual Report: No change from baseline            Cognition Arousal/Alertness: Awake/alert Behavior During Therapy: WFL for tasks assessed/performed;Agitated Overall Cognitive Status: No family/caregiver present to determine baseline cognitive functioning Area of Impairment: Following commands;Attention                         Safety/Judgement: Decreased awareness of safety     General Comments: pt much  less interactive with therapy this day.  pt not following commands this day- gestures or written.                   Pertinent Vitals/ Pain       Pain Assessment: No/denies pain         Frequency  Min 2X/week        Progress Toward Goals  OT Goals(current goals can now be found in the care plan section)  Progress towards OT goals: Not progressing toward goals - comment(pt with decreased participation this day and agitation)     Plan Discharge plan remains appropriate       AM-PAC OT "6 Clicks" Daily Activity     Outcome Measure   Help from another person eating meals?: A Lot Help from another person taking care of personal grooming?: Total Help from another person toileting, which includes using toliet, bedpan, or urinal?: Total Help from another person bathing (including washing, rinsing, drying)?: Total Help from another person to put on and taking off regular upper body clothing?: Total Help from another person to put on and taking off regular lower body clothing?: Total 6 Click Score: 7    End of  Session    OT Visit Diagnosis: Unsteadiness on feet (R26.81);Other abnormalities of gait and mobility (R26.89);Muscle weakness (generalized) (M62.81);History of falling (Z91.81)   Activity Tolerance Treatment limited secondary to agitation   Patient Left in bed;with call bell/phone within reach   Nurse Communication Mobility status        Time: 1143-1200 OT Time Calculation (min): 17 min  Charges: OT General Charges $OT Visit: 1 Visit OT Treatments $Self Care/Home Management : 8-22 mins  Kari Baars, [place] [phone] Office- [phone], Edwena Felty D 10/09/2019, 5:18 PM

## 2019-10-09 NOTE — Progress Notes (Signed)
[facility] Gastroenterology Progress Note  Loretta Brooks 64 y.o. Mar 27, 1956  CC: Anemia, pulmonary embolus, abnormal CT scan   Subjective: Patient seen and examined at bedside.  She remains confused and agitated.  Discussed with nurse assistant.  No evidence of active bleeding.Marland Kitchen  ROS : Not able to obtain   Objective: Vital signs in last 24 hours: Vitals:   10/09/19 0605 10/09/19 0811  BP: (!) 148/71 (!) 152/68  Pulse: 79 82  Resp: 20 20  Temp:    SpO2:  100%    Physical Exam:  General.  Confused.  Not able to obtain any history  Abdomen.  Soft, nontender, nondistended, bowel sounds present Neuro.  Not able to obtain Lab Results: Recent Labs    10/07/19 1335 10/09/19 0531  NA 143 143  K 3.9 3.9  CL 109 108  CO2 16* 24  GLUCOSE 112* 102*  BUN 8 11  CREATININE 1.36* 1.05*  CALCIUM 9.1 9.2   No results for input(s): AST, ALT, ALKPHOS, BILITOT, PROT, ALBUMIN in the last 72 hours. Recent Labs    10/08/19 1637 10/09/19 0531  WBC 10.4 12.3*  HGB 8.0* 8.9*  HCT 28.7* 32.3*  MCV 80.2 81.2  PLT 447* 477*   No results for input(s): LABPROT, INR in the last 72 hours.    Assessment/Plan: -Acute pulmonary embolism.  Currently on heparin drip. -Anemia with abnormal CT scan concerning for masslike thickening of short segment of the distal descending colon as well as concerning nodal metastatic disease along with splenic lesions and lesions on adrenal gland. -Encephalopathy.  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 -Seizure-like activity  Recommendations --------------------- - lesions seen on the CT scan are not amenable for IR guided biopsy per Dr. Kathlene Cote.  - She will need colonoscopy or flexible sigmoidoscopy once other acute issues/encephalopathy/confusion  resolved.  -GI will follow periodically.  Otis Brace MD, [place] 10/09/2019, 9:03 AM  Contact #  [phone]

## 2019-10-09 NOTE — Progress Notes (Signed)
Patient became combative while giving care, trying to hit the staff, screaming, unable to redirect. Fall and seizure precautions in place. Made Pt comfortable. Pt refused to eat dinner. Monitoring closely.

## 2019-10-09 NOTE — Progress Notes (Signed)
PHYSICAL THERAPY  Nursing staff in room attempting to feed pt.  Pt difficult to direct and resistant to care.  Pt has been evaluated with rec to return home with familiar surrounds and family care as prior. Will continue to follow.  Rica Koyanagi  PTA Acute  Rehabilitation Services Pager      [phone] Office      [phone]

## 2019-10-10 LAB — HEPARIN LEVEL (UNFRACTIONATED): Heparin Unfractionated: 0.48 IU/mL (ref 0.30–0.70)

## 2019-10-10 LAB — CULTURE, BLOOD (ROUTINE X 2)
Culture: NO GROWTH
Culture: NO GROWTH
Special Requests: ADEQUATE
Special Requests: ADEQUATE

## 2019-10-10 LAB — CBC
HCT: 33.1 % — ABNORMAL LOW (ref 36.0–46.0)
Hemoglobin: 8.9 g/dL — ABNORMAL LOW (ref 12.0–15.0)
MCH: 22.3 pg — ABNORMAL LOW (ref 26.0–34.0)
MCHC: 26.9 g/dL — ABNORMAL LOW (ref 30.0–36.0)
MCV: 82.8 fL (ref 80.0–100.0)
Platelets: 463 10*3/uL — ABNORMAL HIGH (ref 150–400)
RBC: 4 MIL/uL (ref 3.87–5.11)
RDW: 23.9 % — ABNORMAL HIGH (ref 11.5–15.5)
WBC: 11.1 10*3/uL — ABNORMAL HIGH (ref 4.0–10.5)
nRBC: 0.2 % (ref 0.0–0.2)

## 2019-10-10 MED ORDER — QUETIAPINE FUMARATE 50 MG PO TABS: 50.0000 mg | ORAL_TABLET | Freq: Every day | ORAL | Status: DC

## 2019-10-10 MED ORDER — MORPHINE SULFATE (CONCENTRATE) 10 MG/0.5ML PO SOLN
5.0000 mg | ORAL | Status: DC | PRN
  Administered 2019-10-10: 10 mg via ORAL
  Filled 2019-10-10: qty 0.5

## 2019-10-10 MED ORDER — HALOPERIDOL LACTATE 2 MG/ML PO CONC
2.0000 mg | Freq: Four times a day (QID) | ORAL | Status: DC | PRN
  Filled 2019-10-10: qty 1

## 2019-10-10 MED ORDER — LORAZEPAM 2 MG/ML PO CONC: 1.0000 mg | ORAL | Status: DC | PRN

## 2019-10-10 MED ORDER — QUETIAPINE FUMARATE 25 MG PO TABS
25.0000 mg | ORAL_TABLET | Freq: Every day | ORAL | Status: DC
  Filled 2019-10-10: qty 1

## 2019-10-10 NOTE — Progress Notes (Signed)
ANTICOAGULATION CONSULT NOTE - Follow Up Consult  Pharmacy Consult for Heparin Indication: pulmonary embolus  Not on File  Patient Measurements: Height: 5\' 7"  (170.2 cm) Weight: 202 lb 8 oz (91.9 kg) IBW/kg (Calculated) : 61.6 Heparin Dosing Weight: 82 kg  Vital Signs: Temp: 98.6 F (37 C) (01/21 0624) Temp Source: Oral (01/21 0624) BP: 183/86 (01/21 0624) Pulse Rate: 84 (01/21 0624)  Labs: Recent Labs    10/07/19 1335 10/07/19 1724 10/09/19 0531 10/09/19 0531 10/09/19 1321 10/09/19 1717 10/09/19 1942 10/10/19 0421  HGB  --    < > 8.9*   < >  --  8.6*  --  8.9*  HCT  --    < > 32.3*  --   --  30.9*  --  33.1*  PLT  --    < > 477*  --   --  461*  --  463*  HEPARINUNFRC  --    < > 0.26*   < > 0.45  --  0.56 0.48  CREATININE 1.36*  --  1.05*  --   --   --   --   --    < > = values in this interval not displayed.    Estimated Creatinine Clearance: 63 mL/min (A) (by C-G formula based on SCr of 1.05 mg/dL (H)).  Assessment: Pharmacy consulted to dose/monitor heparin infusion in this 64 year old female diagnosed with acute PE. Pt was not on anticoagulants PTA.  Today, 10/10/2019:  Confirmatory heparin level remains therapeutic on 1500 units/hr  No bleeding or infusion issues per RN  CBC: Hgb remains low but stable. Plt elevated, stable.  Goal of Therapy:  Heparin level 0.3-0.7 units/ml Monitor platelets by anticoagulation protocol: Yes   Plan:   Continue heparin at 1500 units/hr  Daily HL and CBC  Monitor for signs/symptoms of bleeding  Follow along for eventual transition to PO anticoagulation  Efraim Kaufmann, PharmD, BCPS 10/10/19 10:46 AM

## 2019-10-10 NOTE — Progress Notes (Signed)
Patient is alert and agitated, removed the safety mitts and pulled midline. Heparin drip is stopped. Pt refused all her PO meds this AM. MD is notified. Pt requiring constant monitoring.

## 2019-10-10 NOTE — Progress Notes (Addendum)
Daily Progress Note   Patient Name: Loretta Brooks       Date: 10/10/2019 DOB: Jul 18, 1956  Age: 64 y.o. MRN#: [id number] Attending Physician: Karie Kirks, DO Primary Care Physician: System, Pcp Not In Admit Date: 10/05/2019  Reason for Consultation/Follow-up: Establishing goals of care  Subjective: Patient's eyes are open and she is awake but not alert.  She makes gestures as if to say that she wants her mittens removed.  Length of Stay: 5  Current Medications: Scheduled Meds:  . docusate sodium  100 mg Oral BID  . insulin aspart  0-15 Units Subcutaneous TID WC  . insulin aspart  0-5 Units Subcutaneous QHS  . mometasone-formoterol  2 puff Inhalation BID  . pantoprazole (PROTONIX) IV  40 mg Intravenous Q12H  . QUEtiapine  25 mg Oral Daily  . QUEtiapine  50 mg Oral QHS  . sodium chloride flush  10-40 mL Intracatheter Q12H    Continuous Infusions: . sodium chloride Stopped (10/05/19 1727)  . heparin Stopped (10/10/19 1302)  . levETIRAcetam 500 mg (10/10/19 0056)    PRN Meds: acetaminophen **OR** acetaminophen, albuterol, bisacodyl, labetalol, ondansetron **OR** ondansetron (ZOFRAN) IV, senna-docusate, sodium chloride flush, sodium chloride flush, sodium phosphate  Physical Exam         Overall, awake but still confused at times also agitated Regular work of breathing S1-S2 Abdomen is soft nondistended nontender Confused and agitated at times  Vital Signs: BP (!) 183/86 (BP Location: Right Leg)   Pulse 84   Temp 98.6 F (37 C) (Oral)   Resp 20   Ht 5\' 7"  (1.702 m)   Wt 91.9 kg   SpO2 100%   BMI 31.72 kg/m  SpO2: SpO2: 100 % O2 Device: O2 Device: Nasal Cannula O2 Flow Rate: O2 Flow Rate (L/min): 5 L/min  Intake/output summary:   Intake/Output Summary (Last 24  hours) at 10/10/2019 1311 Last data filed at 10/10/2019 [id number] Gross per 24 hour  Intake 765.17 ml  Output 0 ml  Net 765.17 ml   LBM: Last BM Date: (unknown) Baseline Weight: Weight: 91.9 kg Most recent weight: Weight: 91.9 kg       Palliative Assessment/Data:      Patient Active Problem List   Diagnosis Date Noted  . Acute pulmonary embolism ([place]) 10/06/2019  . Acute metabolic encephalopathy [id number]  .  Microcytic anemia 10/06/2019  . Uncontrolled type 2 diabetes mellitus with hyperglycemia ([place]) 10/06/2019  . COPD without exacerbation ([place]) 10/06/2019  . Essential hypertension 10/06/2019  . Acute on chronic respiratory failure with hypoxia ([place]) 10/05/2019    Palliative Care Assessment & Plan   Patient Profile: 64 year old lady with history of breast cancer, thyroid and neck cancer.  Also has diabetes COPD depression and anxiety.  At baseline is with bedbound status and lives at home.  Admitted with shortness of breath.  Found to have pulmonary embolism.  Also found to have mass like thickening distal descending colon.  Also found to have abnormal appearing mesenteric lymph nodes, right adrenal gland nodule, lesions in the spleen all concerning for metastatic disease along with elevated CEA level.  Hospital course complicated by possible seizure type activity, ongoing encephalopathy.  Palliative medicine consultation for goals of care discussions has been requested.  Patient remains admitted to hospital medicine service.  She is being followed by GI specialist.  Assessment: Acute on chronic hypoxic respiratory failure secondary to acute left lung pulmonary embolism Seizure-like activity Ongoing delirium and acute metabolic encephalopathy Colonic mass highly suspicious for malignancy, also found to have CT scan evidence of abnormal mesenteric lymph nodes, lesion in spleen.   Recommendations/Plan: Call placed and again discussed with husband Mr. Hiott on the phone.  We  are continuing to discuss about her CODE STATUS of DO NOT RESUSCITATE, we are continuing to discuss about options for comfort care, transfer to residential hospice towards the end of this hospitalization.  Code Status:    Code Status Orders  (From admission, onward)         Start     Ordered   10/06/19 0214  Full code  Continuous     10/06/19 0213        Code Status History    This patient has a current code status but no historical code status.   Advance Care Planning Activity       Prognosis:  Patient could have a guarded prognosis of possibly less than 2 weeks at this time.  Discharge Planning:  Hospice facility is recommended.   Care plan was discussed with husband on the phone, also discussed with [facility] MD Dr. Benny Lennert  Thank you for allowing the Palliative Medicine Team to assist in the care of this patient.   Time In: 12 Time Out: 12.35 Total Time 35 Prolonged Time Billed  no       Greater than 50%  of this time was spent counseling and coordinating care related to the above assessment and plan.  Loistine Chance, MD  Please contact Palliative Medicine Team phone at [phone] for questions and concerns.

## 2019-10-10 NOTE — Progress Notes (Signed)
PROGRESS NOTE  Loretta Brooks [id number] DOB: Feb 16, 1956 DOA: 10/05/2019 PCP: System, Pcp Not In  Brief History   Loretta Brooks is an 64 y.o. female past medical history of breast cancer in remission, thyroid and neck cancer, diabetes mellitus type 2 COPD oxygen dependent, PE on Xarelto, depression with anxiety and bedboundcomes into the hospital with shortness of breath confusion and poor oral intake that started 3 days prior to admission.  Patient was not able to provide history due to severe hearing impairment.  And most of the history was obtained from the husband.  As per husband she has been having intermittent difficulty breathing that started about 3 days prior to admission.  In the ED she was found to be hypoxic which recovered to 100% with a nonrebreather and wean down to 2 L, with a lactic acid of 4.5, white count of 14 creatinine of 1.2 D-dimer 4 SARS-CoV-2 PCR was negative he was positive, CT angio of the chest with contrast done on admission showed multiple PEs.  CT abdomen and pelvis was performed on 10/06/2019. It demonstrated mass-like thickening of a short segment of distal descending colon with surrounding pericolonic inflammatory changes. GI has been consulted and will take the patient for colonoscopy when she is more stable. This study also demonstrated 2 or more abnormal appearing mesenteric lymph nodes in the left hemi-abdomen, a 2.0 cm right adrenal gland nodule and well as 2 lesions in the spleen all of which are concerning for metastatic disease. CEA is elevated at 5.5.   [facility] were consutled to admit the patient for further evaluation and care.  The patient has been admitted to a telemetry bed. She has had seizure like activity on 10/06/2019 with a post ictal period later that day. She has been started on keppra. MRI brain demonstrated no acute abnormality. EEG was performed. It was within normal limits. Prolonged study including sleep can be  considered as this study was done only during wakefulness.  The patient has been very agitated and confused for the last 4-5 days. Family reports that the patient was also confused, agitated, and behaving in an unusual manner prior to admission. Certainly that has been exacerbated by sedating medications such as ativan and oxycodone, an unfamiliar surrounding, and her acute illness. She has been placed on a delirium protocol. Ativan and oxycodone have been stopped. She has been given Zyprexa with minimal improvement.  Palliative care had been consulted. They discussed the patient with her husband this morning. I have also spoken with the husband. I and Dr. Burt Ek explained that as the patient is tolerating this inpatient experience so poorly, we do not see that she will be able to tolerate the work up of the abnormalities seen on CT abdomen/pelvis or the treatment of the underlying malignancy, as that is most likely the case. He has discussed the patient with his daughter, Loretta Brooks. They have opted to make the patient a DNR and comfort care only. They wish to have her discharged to a facility where she can receive hospice services and comfort care. I have acceeded to their wishes.  Consultants  . Gastroenterology  Procedures  . None  Antibiotics   Anti-infectives (From admission, onward)   Start     Dose/Rate Route Frequency Ordered Stop   10/05/19 1600  ceFEPIme (MAXIPIME) 2 g in sodium chloride 0.9 % 100 mL IVPB     2 g 200 mL/hr over 30 Minutes Intravenous  Once 10/05/19 1548 10/05/19 1654   10/05/19 1545  vancomycin (VANCOCIN) IVPB 1000 mg/200 mL premix     1,000 mg 200 mL/hr over 60 Minutes Intravenous  Once 10/05/19 1533 10/05/19 1819   10/05/19 1545  ceFEPIme (MAXIPIME) 1 g in sodium chloride 0.9 % 100 mL IVPB  Status:  Discontinued     1 g 200 mL/hr over 30 Minutes Intravenous  Once 10/05/19 1533 10/05/19 1548     Subjective  This morning when I visited, the patient was quiet and  seemed more oriented. However, shortly thereafter nursing was requesting a sitter as the patient had again become agitated, and was pulling lines and swinging at nursing.  Objective   Vitals:  Vitals:   10/09/19 2252 10/10/19 0624  BP: 129/76 (!) 183/86  Pulse: 73 84  Resp: 18 20  Temp:  98.6 F (37 C)  SpO2: 100% 100%   Exam:  Constitutional:  . The patient seemed more calm upon my visit, although that quickly changed. Respiratory:  . No increased work of breathing. . No wheezes, rales, or rhonchi . No tactile fremitus Cardiovascular:  . Regular rate and rhythm . No murmurs, ectopy, or gallups. . No lateral PMI. No thrills. Abdomen:  . Abdomen is soft, non-tender, non-distended . No hernias, masses, or organomegaly . Normoactive bowel sounds.  Musculoskeletal:  . No cyanosis, clubbing, or edema Skin:  . No rashes, lesions, ulcers . palpation of skin: no induration or nodules Neurologic:  . Pt is is unable to cooperate with a neurological evaluation. Psychiatric:  . The patient is unable to cooperate with a neurological evaluation.  I have personally reviewed the following:   Today's Data  . Vitals, CBC, Glucoses  Micro Data  . Blood cultures (10/05/2019): No growth  Imaging  . CTA chest, CT abdomen and pelvis, CT head, CXR  Scheduled Meds: . sodium chloride flush  10-40 mL Intracatheter Q12H   Continuous Infusions: . sodium chloride Stopped (10/05/19 1727)  . levETIRAcetam 500 mg (10/10/19 0056)    Active Problems:   Acute on chronic respiratory failure with hypoxia ([facility])   Acute pulmonary embolism ([facility])   Acute metabolic encephalopathy   Microcytic anemia   Uncontrolled type 2 diabetes mellitus with hyperglycemia ([facility])   COPD without exacerbation ([facility])   Essential hypertension   LOS: 5 days   A & P  Acute on chronic respiratory failure with hypoxia secondary to acute left lung PE: She is currently requiring 3 L of oxygen to keep saturations  greater than 94%. Her tachycardia has resolved. A CT angio of the chest shows multiple subsegmental pulmonary emboli nonocclusive. She is currently on IV heparin, this PE is probably due to malignancy as shown by the CT scan see further details below. In accordance with the wishes of the family the patient has been made a DNR and heparin infusion has been discontinued in lieu of comfort care.  Seizure-like activity: She has no no history of seizures. However, this morning  I was called in by the nurse by seizure-like activity and again in there afternoon. In the afternoon she was post-ictalShe has no focal deficit on physical exam.  MRI and EEG were unremarkable. She was started on keppra. She has ativan prn as needed for seizure activity/agitation.  In accordance with the wishes of the family the patient has been made a DNR and she is receiving only comfort care which at this time includes continuation of keppra for seizure prophylaxis.  Delirium: The patient has been very agitated and confused for the last 4-5  days. Family reports that the patient was also confused, agitated, and behaving in an unusual manner prior to admission. Certainly that has been exacerbated by sedating medications such as ativan and oxycodone, an unfamiliar surrounding, and her acute illness. She has been placed on a delirium protocol. Ativan and oxycodone have been stopped. She has been given Zyprexa on 10/09/2019. It does not seem to have helped.  In accordance with the wishes of the family the patient has been made a DNR and is now comfort care. Haldol has been ordered prn for agitation.  Acute metabolic encephalopathy: She has severe hearing impairment.,  Previous CT of the head showed no acute findings just chronic changes. Encephalopathy could likely be due to her hypoxia.  In accordance with the wishes of the family the patient has been made a DNR and is now comfort care. Haldol has been ordered prn for agitation.  Colonic  mass/Occult GI bleed/microcytic anemia: GI to perform colonoscopy when the patient is stable due to the appearance of a potential malignancy in the colon as well as conscern for d there is a concern of metastatic colon cancer.With an FOBT positive, CT angio of the chest positive for subsegmental PE.GI was consulted who recommended a CT scan of the abdomen that showed a mass like thickening in the distal descending colon surrounded by pericolonic inflammatory changes concerning for malignancy, there were also 2 abnormal mesenteric lymph nodes likely metastatic disease.  There were 2 irregular marginated intermediate lesion within the spleen measuring 3.6 cm concerning for metastatic disease. Ferritin was 7 she received IV iron. Palliative care had been consulted. They discussed the patient with her husband this morning. I have also spoken with the husband. I and Dr. Burt Ek explained that as the patient is tolerating this inpatient experience so poorly, we do not see that she will be able to tolerate the work up of the abnormalities seen on CT abdomen/pelvis or the treatment of the underlying malignancy, as that is most likely the case. She has been made a DNR and comfort care in accordance with their wishes.  Acute kidney injury: With a baseline creatinine of less than 1, on admission 1.3, and 1.36 today. Monitor. The patient has been made a DNR and comfort care in accordance with their wishes.  COPD with hypoxia: Patient denies cough or shortness of breath continue inhalers. The patient has been made a DNR and comfort care in accordance with their wishes.   Controlled diabetes mellitus type 2: With an A1c of 5.5, continue sliding scale insulin. She is on a heart healthy carb modified diet and a hypoglycemic protocol. The patient has been made a DNR and comfort care in accordance with their wishes.   DVT prophylaxis: heparin Family Communication: I have discussed the patient in detail with her husband   Today. He and their daughter, Loretta Brooks, have decided to make the patient a DNR and comfort care only pending discharge to a residential hospice. Disposition Plan/Barrier to D/C: She will be discharged to residential hospice when a bed is arranged for. Code Status: DNR Loretta Rossano, DO [facility] Direct contact: see [email or web address]  7PM-7AM contact night coverage as above 10/09/2019, 3:04 PM  LOS: 3 days

## 2019-10-10 NOTE — Progress Notes (Signed)
[facility] Gastroenterology Progress Note  Loretta Brooks 64 y.o. February 28, 1956  CC: Anemia, pulmonary embolus, abnormal CT scan   Subjective: Patient seen and examined at bedside.  She remains confused and agitated.  Discussed with RN.   No evidence of active bleeding.Marland Kitchen  ROS : Not able to obtain   Objective: Vital signs in last 24 hours: Vitals:   10/09/19 2252 10/10/19 0624  BP: 129/76 (!) 183/86  Pulse: 73 84  Resp: 18 20  Temp:  98.6 F (37 C)  SpO2: 100% 100%    Physical Exam:  General.  Confused.  Not able to obtain any history  Abdomen.  Soft, nontender, nondistended, bowel sounds present Neuro.  Not able to obtain Lab Results: Recent Labs    10/07/19 1335 10/09/19 0531  NA 143 143  K 3.9 3.9  CL 109 108  CO2 16* 24  GLUCOSE 112* 102*  BUN 8 11  CREATININE 1.36* 1.05*  CALCIUM 9.1 9.2   No results for input(s): AST, ALT, ALKPHOS, BILITOT, PROT, ALBUMIN in the last 72 hours. Recent Labs    10/09/19 1717 10/10/19 0421  WBC 11.6* 11.1*  HGB 8.6* 8.9*  HCT 30.9* 33.1*  MCV 81.5 82.8  PLT 461* 463*   No results for input(s): LABPROT, INR in the last 72 hours.    Assessment/Plan: -Acute pulmonary embolism.  Currently on heparin drip. -Anemia with abnormal CT scan concerning for masslike thickening of short segment of the distal descending colon as well as concerning nodal metastatic disease along with splenic lesions and lesions on adrenal gland. -Encephalopathy.  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 -Seizure-like activity  Recommendations --------------------- -Hemoglobin stable.  She remains confused and agitated. - She will need colonoscopy or flexible sigmoidoscopy once other acute issues/encephalopathy/confusion  resolved.  He completed  - lesions seen on the CT scan are not amenable for IR guided biopsy per Dr. Kathlene Cote.    -GI will follow  periodically.  Otis Brace MD, [place] 10/10/2019, 9:24 AM  Contact #  [phone]

## 2019-10-10 NOTE — Progress Notes (Signed)
Writer spoke with Pt's spouse and updated.

## 2019-10-11 DIAGNOSIS — R410 Disorientation, unspecified: Secondary | ICD-10-CM

## 2019-10-11 DIAGNOSIS — Z515 Encounter for palliative care: Secondary | ICD-10-CM

## 2019-10-11 DIAGNOSIS — R531 Weakness: Secondary | ICD-10-CM

## 2019-10-11 DIAGNOSIS — K6389 Other specified diseases of intestine: Secondary | ICD-10-CM

## 2019-10-11 MED ORDER — LEVETIRACETAM IN NACL 500 MG/100ML IV SOLN: 500.0000 mg | Freq: Two times a day (BID) | INTRAVENOUS | 0 refills | Status: AC

## 2019-10-11 MED ORDER — LORAZEPAM 2 MG/ML PO CONC: 1.0000 mg | ORAL | 0 refills | Status: AC | PRN

## 2019-10-11 MED ORDER — HALOPERIDOL LACTATE 2 MG/ML PO CONC: 2.0000 mg | Freq: Four times a day (QID) | ORAL | 0 refills | Status: AC | PRN

## 2019-10-11 MED ORDER — ACETAMINOPHEN 650 MG RE SUPP: 650.0000 mg | Freq: Four times a day (QID) | RECTAL | 0 refills | Status: AC | PRN

## 2019-10-11 MED ORDER — MORPHINE SULFATE (CONCENTRATE) 10 MG/0.5ML PO SOLN: 5.0000 mg | ORAL | 0 refills | Status: AC | PRN

## 2019-10-11 NOTE — TOC Initial Note (Signed)
Transition of Care [facility]) - Initial/Assessment Note    Patient Details  Name: Loretta Brooks MRN: [id number] Date of Birth: 07-11-1956  [facility] CM/SW Contact:    Lia Hopping, [place] Phone Number: 10/11/2019, 9:50 AM  Clinical Narrative:                 CSW reached out to the patient spouse to discuss residential hospice facility options. Patient spouse Loretta Brooks prefers the patient stay in the [place] area therefore has requested [employer] [facility]. CSW explain the transfer process to the spouse. He report understanding. CSW made referal to liaison Audrea Muscat. Bed status unknown at [facility]. She will follow up with the spouse today.   Expected Discharge Plan: [place] Barriers to Discharge: No Barriers Identified   Patient Goals and CMS Choice   CMS Medicare.gov Compare Post Acute Care list provided to:: Other (Comment Required)(Spouse) Choice offered to / list presented to : Spouse  Expected Discharge Plan and Services Expected Discharge Plan: [place] In-house Referral: Clinical Social Work Discharge Planning Services: CM Consult   Living arrangements for the past 2 months: [place]                                      Prior Living Arrangements/Services Living arrangements for the past 2 months: Single Family Home Lives with:: Spouse Patient language and need for interpreter reviewed:: No Do you feel safe going back to the place where you live?: No   Residential Hospice  Need for Family Participation in Patient Care: Yes (Comment) Care giver support system in place?: Yes (comment)      Activities of Daily Living Home Assistive Devices/Equipment: Oxygen ADL Screening (condition at time of admission) Patient's cognitive ability adequate to safely complete daily activities?: No Is the patient deaf or have difficulty hearing?: Yes(Hx of sensorineural hearing loss) Does the patient have difficulty seeing, even when  wearing glasses/contacts?: No Does the patient have difficulty concentrating, remembering, or making decisions?: No Patient able to express need for assistance with ADLs?: No Does the patient have difficulty dressing or bathing?: Yes Independently performs ADLs?: No Communication: Independent Dressing (OT): Needs assistance Is this a change from baseline?: Pre-admission baseline Grooming: Needs assistance Is this a change from baseline?: Pre-admission baseline Feeding: Needs assistance Is this a change from baseline?: Pre-admission baseline Bathing: Needs assistance Is this a change from baseline?: Pre-admission baseline Toileting: Needs assistance Is this a change from baseline?: Pre-admission baseline In/Out Bed: Needs assistance Is this a change from baseline?: Pre-admission baseline Walks in Home: Needs assistance Is this a change from baseline?: Pre-admission baseline Does the patient have difficulty walking or climbing stairs?: Yes Weakness of Legs: Both Weakness of Arms/Hands: None  Permission Sought/Granted Permission sought to share information with : Case Manager, Biomedical scientist granted to share info w Relationship: Spouse Loretta Brooks     Emotional Assessment Appearance:: Appears stated age Attitude/Demeanor/Rapport: Unable to Assess Affect (typically observed): Unable to Assess   Alcohol / Substance Use: Not Applicable Psych Involvement: No (comment)  Admission diagnosis:  Acute pulmonary embolism (HCC) [I26.99] Acute on chronic respiratory failure with hypoxia (HCC) [J96.21] Anemia, unspecified type [D64.9] Other pulmonary embolism without acute cor pulmonale, unspecified chronicity ([place]) [I26.99] Patient Active Problem List   Diagnosis Date Noted  . Acute pulmonary embolism ([place]) 10/06/2019  .  Acute metabolic encephalopathy [id number]  . Microcytic anemia 10/06/2019  . Uncontrolled type 2 diabetes mellitus with hyperglycemia  ([place]) 10/06/2019  . COPD without exacerbation ([place]) 10/06/2019  . Essential hypertension 10/06/2019  . Acute on chronic respiratory failure with hypoxia ([place]) 10/05/2019   PCP:  System, Pcp Not In Pharmacy:   [id number] - [place], [place] [address] Phone: [phone] Fax: [phone]     Social Determinants of Health (SDOH) Interventions    Readmission Risk Interventions No flowsheet data found.

## 2019-10-11 NOTE — Progress Notes (Signed)
PROGRESS NOTE  Loretta Brooks [id number] DOB: 09/18/1956 DOA: 10/05/2019 PCP: System, Pcp Not In  Brief History   Ketty Spizzirri is an 64 y.o. female past medical history of breast cancer in remission, thyroid and neck cancer, diabetes mellitus type 2 COPD oxygen dependent, PE on Xarelto, depression with anxiety and bedboundcomes into the hospital with shortness of breath confusion and poor oral intake that started 3 days prior to admission.  Patient was not able to provide history due to severe hearing impairment.  And most of the history was obtained from the husband.  As per husband she has been having intermittent difficulty breathing that started about 3 days prior to admission.  In the ED she was found to be hypoxic which recovered to 100% with a nonrebreather and wean down to 2 L, with a lactic acid of 4.5, white count of 14 creatinine of 1.2 D-dimer 4 SARS-CoV-2 PCR was negative he was positive, CT angio of the chest with contrast done on admission showed multiple PEs.  CT abdomen and pelvis was performed on 10/06/2019. It demonstrated mass-like thickening of a short segment of distal descending colon with surrounding pericolonic inflammatory changes. GI has been consulted and will take the patient for colonoscopy when she is more stable. This study also demonstrated 2 or more abnormal appearing mesenteric lymph nodes in the left hemi-abdomen, a 2.0 cm right adrenal gland nodule and well as 2 lesions in the spleen all of which are concerning for metastatic disease. CEA is elevated at 5.5.   [facility] were consutled to admit the patient for further evaluation and care.  The patient has been admitted to a telemetry bed. She has had seizure like activity on 10/06/2019 with a post ictal period later that day. She has been started on keppra. MRI brain demonstrated no acute abnormality. EEG was performed. It was within normal limits. Prolonged study including sleep can be  considered as this study was done only during wakefulness.  The patient has been very agitated and confused for the last 4-5 days. Family reports that the patient was also confused, agitated, and behaving in an unusual manner prior to admission. Certainly that has been exacerbated by sedating medications such as ativan and oxycodone, an unfamiliar surrounding, and her acute illness. She has been placed on a delirium protocol. Ativan and oxycodone have been stopped. She has been given Zyprexa with minimal improvement.  Palliative care had been consulted. They discussed the patient with her husband this morning. I have also spoken with the husband. I and Dr. Burt Ek explained that as the patient is tolerating this inpatient experience so poorly, we do not see that she will be able to tolerate the work up of the abnormalities seen on CT abdomen/pelvis or the treatment of the underlying malignancy, as that is most likely the case. He has discussed the patient with his daughter, Loretta Brooks. They have opted to make the patient a DNR and comfort care only. They wish to have her discharged to a facility where she can receive hospice services and comfort care. I have acceeded to their wishes.  Consultants  . Gastroenterology  Procedures  . None  Antibiotics   Anti-infectives (From admission, onward)   Start     Dose/Rate Route Frequency Ordered Stop   10/05/19 1600  ceFEPIme (MAXIPIME) 2 g in sodium chloride 0.9 % 100 mL IVPB     2 g 200 mL/hr over 30 Minutes Intravenous  Once 10/05/19 1548 10/05/19 1654   10/05/19 1545  vancomycin (VANCOCIN) IVPB 1000 mg/200 mL premix     1,000 mg 200 mL/hr over 60 Minutes Intravenous  Once 10/05/19 1533 10/05/19 1819   10/05/19 1545  ceFEPIme (MAXIPIME) 1 g in sodium chloride 0.9 % 100 mL IVPB  Status:  Discontinued     1 g 200 mL/hr over 30 Minutes Intravenous  Once 10/05/19 1533 10/05/19 1548     Subjective  The patient is sleeping comfortably. No agitation.    Objective   Vitals:  Vitals:   10/11/19 0635 10/11/19 1318  BP: 98/72 140/74  Pulse:  79  Resp: 20 16  Temp: 98.1 F (36.7 C) 98.1 F (36.7 C)  SpO2: 100% 100%   Exam:  Constitutional:  . The patient is sleeping quietly. No acute distress. Respiratory:  . No increased work of breathing. . No wheezes, rales, or rhonchi . No tactile fremitus Cardiovascular:  . Regular rate and rhythm . No murmurs, ectopy, or gallups. . No lateral PMI. No thrills. Abdomen:  . Abdomen is soft, non-tender, non-distended . No hernias, masses, or organomegaly . Normoactive bowel sounds.  Musculoskeletal:  . No cyanosis, clubbing, or edema Skin:  . No rashes, lesions, ulcers . palpation of skin: no induration or nodules Neurologic:  . Pt is is unable to cooperate with a neurological evaluation. Psychiatric:  . The patient is unable to cooperate with a neurological evaluation.  I have personally reviewed the following:   Today's Data  . Orthoptist  . Blood cultures (10/05/2019): No growth  Imaging  . CTA chest, CT abdomen and pelvis, CT head, CXR  Scheduled Meds: . sodium chloride flush  10-40 mL Intracatheter Q12H   Continuous Infusions: . sodium chloride Stopped (10/05/19 1727)  . levETIRAcetam 500 mg (10/11/19 0036)    Active Problems:   Acute on chronic respiratory failure with hypoxia (HCC)   Acute pulmonary embolism (HCC)   Acute metabolic encephalopathy   Microcytic anemia   Uncontrolled type 2 diabetes mellitus with hyperglycemia (HCC)   COPD without exacerbation (HCC)   Essential hypertension   LOS: 6 days   A & P  Acute on chronic respiratory failure with hypoxia secondary to acute left lung PE: She is currently requiring 3 L of oxygen to keep saturations greater than 94%. Her tachycardia has resolved. A CT angio of the chest shows multiple subsegmental pulmonary emboli nonocclusive. She is currently on IV heparin, this PE is probably due to malignancy as  shown by the CT scan see further details below. In accordance with the wishes of the family the patient has been made a DNR and heparin infusion has been discontinued in lieu of comfort care.  Seizure-like activity: She has no no history of seizures. However, this morning  I was called in by the nurse by seizure-like activity and again in there afternoon. In the afternoon she was post-ictalShe has no focal deficit on physical exam.  MRI and EEG were unremarkable. She was started on keppra. She has ativan prn as needed for seizure activity/agitation.  In accordance with the wishes of the family the patient has been made a DNR and she is receiving only comfort care which at this time includes continuation of keppra for seizure prophylaxis.  Delirium: The patient has been very agitated and confused for the last 4-5 days. Family reports that the patient was also confused, agitated, and behaving in an unusual manner prior to admission. Certainly that has been exacerbated by sedating medications such as ativan and oxycodone,  an unfamiliar surrounding, and her acute illness. She has been placed on a delirium protocol. Ativan and oxycodone have been stopped. She has been given Zyprexa on 10/09/2019. It does not seem to have helped.  In accordance with the wishes of the family the patient has been made a DNR and is now comfort care. Haldol has been ordered prn for agitation.  Acute metabolic encephalopathy: She has severe hearing impairment.,  Previous CT of the head showed no acute findings just chronic changes. Encephalopathy could likely be due to her hypoxia.  In accordance with the wishes of the family the patient has been made a DNR and is now comfort care. Haldol has been ordered prn for agitation.  Colonic mass/Occult GI bleed/microcytic anemia: GI to perform colonoscopy when the patient is stable due to the appearance of a potential malignancy in the colon as well as conscern for d there is a concern of  metastatic colon cancer.With an FOBT positive, CT angio of the chest positive for subsegmental PE.GI was consulted who recommended a CT scan of the abdomen that showed a mass like thickening in the distal descending colon surrounded by pericolonic inflammatory changes concerning for malignancy, there were also 2 abnormal mesenteric lymph nodes likely metastatic disease.  There were 2 irregular marginated intermediate lesion within the spleen measuring 3.6 cm concerning for metastatic disease. Ferritin was 7 she received IV iron. Palliative care had been consulted. They discussed the patient with her husband this morning. I have also spoken with the husband. I and Dr. Burt Ek explained that as the patient is tolerating this inpatient experience so poorly, we do not see that she will be able to tolerate the work up of the abnormalities seen on CT abdomen/pelvis or the treatment of the underlying malignancy, as that is most likely the case. She has been made a DNR and comfort care in accordance with their wishes.  Acute kidney injury: With a baseline creatinine of less than 1, on admission 1.3, and 1.36 today. Monitor. The patient has been made a DNR and comfort care in accordance with their wishes.  COPD with hypoxia: Patient denies cough or shortness of breath continue inhalers. The patient has been made a DNR and comfort care in accordance with their wishes.   Controlled diabetes mellitus type 2: With an A1c of 5.5, continue sliding scale insulin. She is on a heart healthy carb modified diet and a hypoglycemic protocol. The patient has been made a DNR and comfort care in accordance with their wishes.   DVT prophylaxis: heparin Family Communication: I have discussed the patient in detail with her husband  Today. He and their daughter, Loretta Brooks, have decided to make the patient a DNR and comfort care only pending discharge to a residential hospice. Disposition Plan/Barrier to D/C: She will be discharged  to residential hospice when a bed is arranged for. Code Status: DNR Loretta Gruhn, DO [facility] Direct contact: see [email or web address]  7PM-7AM contact night coverage as above 10/11/2019, 2:26 PM  LOS: 3 days

## 2019-10-11 NOTE — Discharge Summary (Signed)
Physician Discharge Summary  [name] [id number] DOB: 01/27/1956 DOA: 10/05/2019  PCP: System, Pcp Not In  Admit date: 10/05/2019 Discharge date: 10/11/2019  Recommendations for Outpatient Follow-up:  1. Discharge to [facility] for end of life comfort care.     Discharge Diagnoses: Principal diagnosis is #1 1. Colon mass 2. Likely metastatic lesions in spleen, adrenal gland and adenopathy 3. Toxic encephalopathy 4. AKI 5. COPD with hypoxia 6. DM II 7. Delirium  Discharge Condition: Serious  Disposition: [place]  Diet recommendation: Pleasure feeds  Filed Weights   10/05/19 1412  Weight: 91.9 kg    History of present illness:   Loretta Brooks is a 64 y.o. female with history of breast cancer in remission, thyroid and neck cancer, DM-2, COPD not on oxygen, PE on Xarelto, HTN, hypothyroidism, hearing impairment, depression, anxiety and chronic debility presenting with shortness of breath, confusion, weakness and poor p.o. intake for about 3 days.   Patient was not able to provide history due to severe hearing impairment.  She is awake and follows command with sign instruction.   Per patient's husband, patient has been having intermittent left-sided stiffness, difficulty breathing and strange behavior for the last 3 days.  She has been rolling her eyes back.  "It is just hard to describe what she has been doing for the last 3 days" and he called EMS today.  He denies history of seizure or stroke.  He denies fever, nausea, vomiting, cough or diarrhea.  Denies focal weakness.  Reports chronic slurred speech.  He says she has not been able to mobilize for years.  She is wheelchair-bound.  Dependent for some ADLs.  Per patient's husband, she does take a lot of medication including blood thinners but not sure if she has been consistent as she does not let the family manage her medications.  She has not been vocal or hear for over a month.    Lives with her husband.  Per husband, denies smoking cigarettes, drinking alcohol recreational drug use.  In ED, hypoxic to 68% on RA but recovered to 100% with NRB, and weaned to 2.5 L by HFNC.  Hemodynamically stable.  ABG not impressive but obtained on NRB.  WBC 14.5.  Hgb 7.1 (no baseline).  Creatinine 1.22.  Glucose 130.  D-dimer 3.91.  Lactic acid 4.5.  BNP and troponin within normal.  COVID-19 antigen test negative.  FOBT positive. CXR and CT chest without acute finding.  CTA chest with multiple small nonocclusive and subsegmental PE in left lung, right adrenal nodule, atherosclerosis and calcified coronaries.  Per EDP, [facility], Dr. Therisa Doyne consulted and okay with heparin drip for PE.  Blood culture, anemia panel, and 1 unit of blood ordered.  Received 1 L NS bolus.  Started on vancomycin and cefepime and hospital service was called for admission.  Confirmatory Covid test negative.  Hospital Course:  The patient has been admitted to a telemetry bed. She has had seizure like activity on 10/06/2019 with a post ictal period later that day. She has been started on keppra. MRI brain demonstrated no acute abnormality. EEG was performed. It was within normal limits. Prolonged study including sleep can be considered as this study was done only during wakefulness.  The patient has been very agitated and confused for the last 4-5 days. Family reports that the patient was also confused, agitated, and behaving in an unusual manner prior to admission. Certainly that has been exacerbated by sedating medications such as ativan and  oxycodone, an unfamiliar surrounding, and her acute illness. She has been placed on a delirium protocol. Ativan and oxycodone have been stopped. She has been given Zyprexa with minimal improvement.  Palliative care had been consulted. They discussed the patient with her husband this morning. I have also spoken with the husband. I and Dr. Burt Ek explained that as the patient is  tolerating this inpatient experience so poorly, we do not see that she will be able to tolerate the work up of the abnormalities seen on CT abdomen/pelvis or the treatment of the underlying malignancy, as that is most likely the case. He has discussed the patient with his daughter, Ellison Hughs. They have opted to make the patient a DNR and comfort care only. They wish to have her discharged to a facility where she can receive hospice services and comfort care. The patient will be discharged to [facility] today for continued comfort care.  Today's assessment: S: The patient is sleeping. No distress. O: Vitals:  Vitals:   10/11/19 0635 10/11/19 1318  BP: 98/72 140/74  Pulse:  79  Resp: 20 16  Temp: 98.1 F (36.7 C) 98.1 F (36.7 C)  SpO2: 100% 100%   Constitutional:   The patient is sleeping quietly. No acute distress. Respiratory:   No increased work of breathing.  No wheezes, rales, or rhonchi  No tactile fremitus Cardiovascular:   Regular rate and rhythm  No murmurs, ectopy, or gallups.  No lateral PMI. No thrills. Abdomen:   Abdomen is soft, non-tender, non-distended  No hernias, masses, or organomegaly  Normoactive bowel sounds.  Musculoskeletal:   No cyanosis, clubbing, or edema Skin:   No rashes, lesions, ulcers  palpation of skin: no induration or nodules Neurologic:   Pt is is unable to cooperate with a neurological evaluation. Psychiatric:   The patient is unable to cooperate with a neurological evaluation.  Discharge Instructions  Discharge Instructions    Activity as tolerated - No restrictions   Complete by: As directed    Call MD for:  severe uncontrolled pain   Complete by: As directed    Diet general   Complete by: As directed    Discharge instructions   Complete by: As directed    Discharge to [facility] for end of life comfort care.   Increase activity slowly   Complete by: As directed      Allergies as of 10/11/2019   Not  on File     Medication List    STOP taking these medications   Accu-Chek FastClix Lancets Misc   Accu-Chek Guide test strip Generic drug: glucose blood   amLODipine 10 MG tablet Commonly known as: NORVASC   baclofen 10 MG tablet Commonly known as: LIORESAL   escitalopram 20 MG tablet Commonly known as: LEXAPRO   hydrOXYzine 25 MG tablet Commonly known as: ATARAX/VISTARIL   levothyroxine 200 MCG tablet Commonly known as: SYNTHROID   metFORMIN 500 MG tablet Commonly known as: GLUCOPHAGE   methocarbamol 500 MG tablet Commonly known as: ROBAXIN   metoprolol succinate 25 MG 24 hr tablet Commonly known as: TOPROL-XL   sulfamethoxazole-trimethoprim 800-160 MG tablet Commonly known as: BACTRIM DS   tiZANidine 4 MG tablet Commonly known as: ZANAFLEX   zolpidem 12.5 MG CR tablet Commonly known as: AMBIEN CR     TAKE these medications   acetaminophen 650 MG suppository Commonly known as: TYLENOL Place 1 suppository (650 mg total) rectally every 6 (six) hours as needed for mild pain (or Fever >/=  101).   haloperidol 2 MG/ML solution Commonly known as: HALDOL Take 1 mL (2 mg total) by mouth every 6 (six) hours as needed for agitation.   levETIRAcetam 500 MG/100ML Soln Commonly known as: KEPRRA Inject 100 mLs (500 mg total) into the vein every 12 (twelve) hours.   LORazepam 2 MG/ML concentrated solution Commonly known as: ATIVAN Take 0.5 mLs (1 mg total) by mouth every 4 (four) hours as needed for anxiety or seizure.   morphine CONCENTRATE 10 MG/0.5ML Soln concentrated solution Take 0.25-0.5 mLs (5-10 mg total) by mouth every 2 (two) hours as needed for severe pain.       Not on File  The results of significant diagnostics from this hospitalization (including imaging, microbiology, ancillary and laboratory) are listed below for reference.    Significant Diagnostic Studies: CT Head Wo Contrast  Result Date: 10/05/2019 CLINICAL DATA:  Altered mental status.  EXAM: CT HEAD WITHOUT CONTRAST TECHNIQUE: Contiguous axial images were obtained from the base of the skull through the vertex without intravenous contrast. COMPARISON:  Feb 01, 2009 FINDINGS: Brain: No evidence of acute infarction, hemorrhage, hydrocephalus, extra-axial collection or mass lesion/mass effect. 8 mm area of hypoattenuation in the subcortical/periventricular right frontal lobe is stable. Vascular: No hyperdense vessel or unexpected calcification. Skull: No evidence of fracture. Postsurgical and post reconstruction changes from left maxillary resection. Sinuses/Orbits: The left sphenoid sinus and left mastoid air cells are opacified. Other: None. IMPRESSION: 1. No acute intracranial abnormality. 2. Chronic 8 mm area of hypoattenuation in the subcortical/periventricular right frontal lobe. 3. Opacification of the left sphenoid sinus and left mastoid air cells. Electronically Signed   By: Fidela Salisbury M.D.   On: 10/05/2019 15:31   CT Angio Chest PE W and/or Wo Contrast  Result Date: 10/05/2019 CLINICAL DATA:  64 year old female with history of shortness of breath. Elevated D-dimer. EXAM: CT ANGIOGRAPHY CHEST WITH CONTRAST TECHNIQUE: Multidetector CT imaging of the chest was performed using the standard protocol during bolus administration of intravenous contrast. Multiplanar CT image reconstructions and MIPs were obtained to evaluate the vascular anatomy. CONTRAST:  165mL OMNIPAQUE IOHEXOL 350 MG/ML SOLN COMPARISON:  No priors. FINDINGS: Cardiovascular: Several nonocclusive filling defects are noted within segmental and subsegmental sized pulmonary artery branches in the left lung, most evident in the left upper lobe, compatible with pulmonary embolism. Heart size is mildly enlarged. There is no significant pericardial fluid, thickening or pericardial calcification. There is aortic atherosclerosis, as well as atherosclerosis of the great vessels of the mediastinum and the coronary arteries,  including calcified atherosclerotic plaque in the left main, left anterior descending, left circumflex and right coronary arteries. Mediastinum/Nodes: No pathologically enlarged mediastinal or hilar lymph nodes. Esophagus is unremarkable in appearance. No axillary lymphadenopathy. Lungs/Pleura: Linear areas of scarring and/or atelectasis throughout the mid to lower lungs bilaterally. No acute consolidative airspace disease. No pleural effusions. Upper Abdomen: 2.0 x 1.5 cm right adrenal nodule incompletely characterized. Aortic atherosclerosis. Musculoskeletal: There are no aggressive appearing lytic or blastic lesions noted in the visualized portions of the skeleton. Review of the MIP images confirms the above findings. IMPRESSION: 1. Multiple small nonocclusive segmental and subsegmental sized pulmonary emboli in the left lung. 2. Aortic atherosclerosis, in addition to left main and 3 vessel coronary artery disease. Please note that although the presence of coronary artery calcium documents the presence of coronary artery disease, the severity of this disease and any potential stenosis cannot be assessed on this non-gated CT examination. Assessment for potential risk factor modification, dietary  therapy or pharmacologic therapy may be warranted, if clinically indicated. 3. 2.0 x 1.5 cm right adrenal nodule, indeterminate. Statistically, this is likely benign. If there is no prior history of cancer, follow-up adrenal protocol CT scan would be recommended in 12 months. Alternatively, if there is a prior history of cancer, adrenal protocol CT in the near future would be recommended. Aortic Atherosclerosis (ICD10-I70.0). Electronically Signed   By: Vinnie Langton M.D.   On: 10/05/2019 17:01   MR BRAIN WO CONTRAST  Result Date: 10/07/2019 CLINICAL DATA:  Acute neuro deficit.  Confusion.  History of cancer. EXAM: MRI HEAD WITHOUT CONTRAST TECHNIQUE: Multiplanar, multiecho pulse sequences of the brain and  surrounding structures were obtained without intravenous contrast. COMPARISON:  CT head 10/05/2019 FINDINGS: Limited study. The patient was moving throughout the study and was not able to complete most of the study. Limited imaging demonstrates no acute infarct. Mild atrophy. No mass lesion. Chronic appearing ischemia in the deep white matter on the right and in the right thalamus. Probable chronic ischemia in the pons. No fluid collection or midline shift. Prior resection of left maxilla. Mucosal edema paranasal sinuses. Left mastoid effusion. IMPRESSION: Limited study.  No acute abnormality. Electronically Signed   By: Franchot Gallo M.D.   On: 10/07/2019 15:25   CT ABDOMEN PELVIS W CONTRAST  Result Date: 10/06/2019 CLINICAL DATA:  Anemia EXAM: CT ABDOMEN AND PELVIS WITH CONTRAST TECHNIQUE: Multidetector CT imaging of the abdomen and pelvis was performed using the standard protocol following bolus administration of intravenous contrast. CONTRAST:  177mL OMNIPAQUE IOHEXOL 300 MG/ML  SOLN COMPARISON:  None. FINDINGS: Lower chest: Bibasilar subsegmental atelectasis. Mild cardiomegaly. Coronary artery calcifications. Hepatobiliary: Liver is unremarkable. No focal liver lesion. Faint hyperdense layering material within the gallbladder lumen suggesting excretion of contrast given recent contrasted study. Pancreas: Rounded 12 mm low-density lesion adjacent to the anterior aspect of the pancreatic tail (series 2, image 26). No peripancreatic inflammatory changes. Single punctate calcification in the pancreatic uncinate process. Spleen: Two irregularly marginated intermediate density lesions within the spleen. The more superiorly oriented lesion measures approximately 2.2 cm while the more inferiorly located lesion measures approximately 3.6 cm. Adrenals/Urinary Tract: Indeterminate density 2.0 x 1.3 cm right adrenal gland nodule. Left adrenal gland is unremarkable. 2.1 cm inferior pole right renal cyst. Left kidney is  unremarkable. No solid renal lesion or hydronephrosis. Ureters are nondilated. Urinary bladder is mildly distended with excreted contrast. No evidence of focal bladder wall thickening. Stomach/Bowel: Masslike thickening of a short segment of distal descending colon (series 2, image 49; series 5, image 92) there are surrounding pericolonic inflammatory changes adjacent to this segment. Remainder of the bowel appears within normal limits. No evidence of bowel obstruction. Vascular/Lymphatic: There are 2 rounded mesenteric lymph nodes in the left hemiabdomen, the largest measuring 12 mm short axis (series 2, image 38). Additional nodular densities adjacent to the abnormal segment of colon may reflect additional lymph nodes (series 5, image 90). Aortoiliac atherosclerosis. No aneurysm. Reproductive: Status post hysterectomy. No adnexal masses. Other: Small volume of hyperdense free fluid within the pelvis on the right (series 2, image 61) which may reflect blood products. Musculoskeletal: Multiple degenerative Schmorl's nodes in the lower thoracic and upper lumbar spine. No sinister osseous lesion. No acute osseous findings. IMPRESSION: 1. Masslike thickening of a short segment of distal descending colon with surrounding pericolonic inflammatory changes. Findings are concerning for primary colon malignancy. 2. There are at least 2 abnormal appearing mesenteric lymph nodes in the left hemiabdomen,  suspicious for nodal metastatic disease. 3. Two irregularly marginated intermediate density lesions within the spleen, largest measuring up to 3.6 cm. Findings are concerning for metastasis. 4. Indeterminate density 2.0 cm right adrenal gland nodule, also concerning for metastatic lesion given the above findings. 5. Small volume hyperdense free fluid within the pelvis on the right may reflect blood products. These results will be called to the ordering clinician or representative by the Radiologist Assistant, and communication  documented in the PACS or zVision Dashboard. Electronically Signed   By: Davina Poke D.O.   On: 10/06/2019 17:00   DG Chest Port 1 View  Result Date: 10/05/2019 CLINICAL DATA:  64 year old female with shortness of breath. EXAM: PORTABLE CHEST 1 VIEW COMPARISON:  None. FINDINGS: Mild chronic bronchitic changes. No focal consolidation, pleural effusion or pneumothorax. Borderline cardiomegaly. Atherosclerotic calcification of the aortic arch. No acute osseous pathology. Thyroidectomy surgical clips. IMPRESSION: No acute cardiopulmonary process. Electronically Signed   By: Anner Crete M.D.   On: 10/05/2019 15:28   EEG adult  Result Date: 10/08/2019 Lora Havens, MD     10/08/2019  8:26 AM Patient Name: Loretta Brooks MRN: [id number] Epilepsy Attending: Lora Havens Referring Physician/Provider: Dr. Marguarite Arbour Date: 10/07/2019 Duration: 25.37 minutes Patient history: 64 year old female who was admitted for acute hypoxic failure and altered mental status.  She was noted to have seizure-like activityX2.  EEG ordered to evaluate for altered mental status and seizures. Level of alertness: Awake AEDs during EEG study: Keppra Technical aspects: This EEG study was done with scalp electrodes positioned according to the 10-20 International system of electrode placement. Electrical activity was acquired at a sampling rate of 500Hz  and reviewed with a high frequency filter of 70Hz  and a low frequency filter of 1Hz . EEG data were recorded continuously and digitally stored. Description:The posterior dominant rhythm consists of 8-9 Hz activity of moderate voltage (25-35 uV) seen predominantly in posterior head regions, symmetric and reactive to eye opening and eye closing.  Hyperventilation and photic stimulation were not performed. Of note, EEG was technically difficult due to constant movement artifact. IMPRESSION: This technically difficult study is within normal limits. No seizures or epileptiform  discharges were seen throughout the recording. However, only wakefulness was recorded. If suspicion for interictal activity remains a concern, a prolonged study including sleep can be considered. Priyanka O Yadav   [place] EKG SITE RITE  Result Date: 10/07/2019 If Site Rite image not attached, placement could not be confirmed due to current cardiac rhythm.   Microbiology: Recent Results (from the past 240 hour(s))  Blood culture (routine x 2)     Status: None   Collection Time: 10/05/19  2:31 PM   Specimen: BLOOD RIGHT HAND  Result Value Ref Range Status   Specimen Description   Final    BLOOD RIGHT HAND Performed at [facility], [address], [place], [place] [address]    Special Requests   Final    BOTTLES DRAWN AEROBIC ONLY Blood Culture adequate volume Performed at [facility] [address], [place], [place] [address]    Culture   Final    NO GROWTH 5 DAYS Performed at [facility], [place] [address] [place], [place] [address]    Report Status 10/10/2019 FINAL  Final  Blood culture (routine x 2)     Status: None   Collection Time: 10/05/19  2:36 PM   Specimen: BLOOD  Result Value Ref Range Status   Specimen Description  Final    BLOOD LEFT ANTECUBITAL Performed at [facility] [address], [place], [place] [address]    Special Requests   Final    BOTTLES DRAWN AEROBIC ONLY Blood Culture adequate volume Performed at [facility] [address], [place], [place] [address]    Culture   Final    NO GROWTH 5 DAYS Performed at [facility], [place] [address] [place], [place] [address]    Report Status 10/10/2019 FINAL  Final  SARS CORONAVIRUS 2 (TAT 6-24 HRS) Nasopharyngeal Nasopharyngeal Swab     Status: None   Collection Time: 10/05/19  5:15 PM   Specimen: Nasopharyngeal Swab  Result Value Ref Range Status   SARS Coronavirus 2 NEGATIVE NEGATIVE Final    Comment: (NOTE) SARS-CoV-2 target  nucleic acids are NOT DETECTED. The SARS-CoV-2 RNA is generally detectable in upper and lower respiratory specimens during the acute phase of infection. Negative results do not preclude SARS-CoV-2 infection, do not rule out co-infections with other pathogens, and should not be used as the sole basis for treatment or other patient management decisions. Negative results must be combined with clinical observations, patient history, and epidemiological information. The expected result is Negative. Fact Sheet for Patients: SugarRoll.be Fact Sheet for Healthcare Providers: https://www.woods-mathews.com/ This test is not yet approved or cleared by the [place] FDA and  has been authorized for detection and/or diagnosis of SARS-CoV-2 by FDA under an Emergency Use Authorization (EUA). This EUA will remain  in effect (meaning this test can be used) for the duration of the COVID-19 declaration under Section 56 4(b)(1) of the Act, 21 U.S.C. section 360bbb-3(b)(1), unless the authorization is terminated or revoked sooner. Performed at [facility], [place] [address] [place], [place] [address]      Labs: Basic Metabolic Panel: Recent Labs  Lab 10/05/19 1428 10/06/19 0416 10/07/19 1335 10/09/19 0531  NA 144 143 143 143  K 3.8 3.9 3.9 3.9  CL 108 110 109 108  CO2 22 22 16* 24  GLUCOSE 130* 115* 112* 102*  BUN 13 10 8 11   CREATININE 1.22* 0.93 1.36* 1.05*  CALCIUM 8.9 8.3* 9.1 9.2   Liver Function Tests: Recent Labs  Lab 10/06/19 0416  AST 15  ALT 9  ALKPHOS 79  BILITOT 0.3  PROT 7.0  ALBUMIN 3.6   No results for input(s): LIPASE, AMYLASE in the last 168 hours. Recent Labs  Lab 10/05/19 1908  AMMONIA 40*   CBC: Recent Labs  Lab 10/08/19 0554 10/08/19 1637 10/09/19 0531 10/09/19 1717 10/10/19 0421  WBC 10.2 10.4 12.3* 11.6* 11.1*  HGB 7.7* 8.0* 8.9* 8.6* 8.9*  HCT 26.7* 28.7* 32.3* 30.9* 33.1*  MCV 76.5* 80.2 81.2  81.5 82.8  PLT 460* 447* 477* 461* 463*   Cardiac Enzymes: No results for input(s): CKTOTAL, CKMB, CKMBINDEX, TROPONINI in the last 168 hours. BNP: BNP (last 3 results) Recent Labs    10/05/19 1430  BNP 73.9    ProBNP (last 3 results) No results for input(s): PROBNP in the last 8760 hours.  CBG: Recent Labs  Lab 10/08/19 2254 10/09/19 0735 10/09/19 1141 10/09/19 1647 10/09/19 2253  GLUCAP 86 91 85 92 96    Active Problems:   Acute on chronic respiratory failure with hypoxia (HCC)   Acute pulmonary embolism (HCC)   Acute metabolic encephalopathy   Microcytic anemia   Uncontrolled type 2 diabetes mellitus with hyperglycemia (HCC)   COPD without exacerbation ([place])   Essential  hypertension   Time coordinating discharge: 38 minutes  Signed:        Zlaty Alexa, DO [facility]  10/11/2019, 2:44 PM

## 2019-10-11 NOTE — Progress Notes (Signed)
Patient seen and examined at bedside.  Patient resting comfortably in the bed.  Sitter at bedside.    Primary care physician and palliative care notes reviewed.  Family has decided for comfort care.  GI will sign off.  Call us back if needed  Otis Brace MD, [place] 10/11/2019, 9:08 AM  Contact #  [phone]

## 2019-10-11 NOTE — Progress Notes (Signed)
Daily Progress Note   Patient Name: Loretta Brooks       Date: 10/11/2019 DOB: [birth year]  Age: 64 y.o. MRN#: [id number] Attending Physician: Karie Kirks, DO Primary Care Physician: System, Pcp Not In Admit Date: 10/05/2019  Reason for Consultation/Follow-up: Establishing goals of care  Subjective: Patient is resting comfortably, not alert. Sitter at bedside, discussed with CSW    Length of Stay: 6  Current Medications: Scheduled Meds:  . sodium chloride flush  10-40 mL Intracatheter Q12H    Continuous Infusions: . sodium chloride Stopped (10/05/19 1727)  . levETIRAcetam 500 mg (10/11/19 0036)    PRN Meds: acetaminophen **OR** acetaminophen, haloperidol, LORazepam, morphine CONCENTRATE, [DISCONTINUED] ondansetron **OR** ondansetron (ZOFRAN) IV, senna-docusate  Physical Exam         Asleep but comfortable Safety sitter at bedside [place] off Regular work of breathing S1-S2 Abdomen is soft nondistended nontender    Vital Signs: BP 98/72 (BP Location: Right Leg)   Pulse 72   Temp 98.1 F (36.7 C) (Oral)   Resp 20   Ht 5\' 7"  (1.702 m)   Wt 91.9 kg   SpO2 100%   BMI 31.72 kg/m  SpO2: SpO2: 100 % O2 Device: O2 Device: Nasal Cannula O2 Flow Rate: O2 Flow Rate (L/min): 2 L/min  Intake/output summary:   Intake/Output Summary (Last 24 hours) at 10/11/2019 0959 Last data filed at 10/11/2019 0700 Gross per 24 hour  Intake 272.5 ml  Output 100 ml  Net 172.5 ml   LBM: Last BM Date: (unknown) Baseline Weight: Weight: 91.9 kg Most recent weight: Weight: 91.9 kg       Palliative Assessment/Data:      Patient Active Problem List   Diagnosis Date Noted  . Acute pulmonary embolism ([place]) 10/06/2019  . Acute metabolic encephalopathy [id number]  . Microcytic anemia  10/06/2019  . Uncontrolled type 2 diabetes mellitus with hyperglycemia ([place]) 10/06/2019  . COPD without exacerbation ([place]) 10/06/2019  . Essential hypertension 10/06/2019  . Acute on chronic respiratory failure with hypoxia ([place]) 10/05/2019    Palliative Care Assessment & Plan   Patient Profile: 64 year old lady with history of breast cancer, thyroid and neck cancer.  Also has diabetes COPD depression and anxiety.  At baseline is with bedbound status and lives at home.  Admitted with shortness of breath.  Found to have  pulmonary embolism.  Also found to have mass like thickening distal descending colon.  Also found to have abnormal appearing mesenteric lymph nodes, right adrenal gland nodule, lesions in the spleen all concerning for metastatic disease along with elevated CEA level.  Hospital course complicated by possible seizure type activity, ongoing encephalopathy.  Palliative medicine consultation for goals of care discussions has been requested.  Patient remains admitted to hospital medicine service.  She is being followed by GI specialist.  Assessment: Acute on chronic hypoxic respiratory failure secondary to acute left lung pulmonary embolism Seizure-like activity Ongoing delirium and acute metabolic encephalopathy Colonic mass highly suspicious for malignancy, also found to have CT scan evidence of abnormal mesenteric lymph nodes, lesion in spleen.   Recommendations/Plan: Recommend comfort care, transfer to residential hospice towards the end of this hospitalization.  Code Status:    Code Status Orders  (From admission, onward)         Start     Ordered   10/06/19 0214  Full code  Continuous     10/06/19 0213        Code Status History    This patient has a current code status but no historical code status.   Advance Care Planning Activity       Prognosis:  Patient could have a guarded prognosis of possibly less than 2 weeks at this time.  Discharge  Planning:  Hospice facility is recommended.   Care plan was discussed with CSW  Thank you for allowing the Palliative Medicine Team to assist in the care of this patient.   Time In: 9 Time Out: 9.25 Total Time 25 Prolonged Time Billed  no       Greater than 50%  of this time was spent counseling and coordinating care related to the above assessment and plan.  Loistine Chance, MD  Please contact Palliative Medicine Team phone at [phone] for questions and concerns.

## 2019-10-11 NOTE — Progress Notes (Signed)
Engineer, maintenance [facility]) Hospital Liaison note.   Received request from [place] for family interest in Center with request for transfer today. Chart reviewed and eligibility confirmed. Spoke with family to confirm interest and explain services. Family agreeable to transfer today. CSW aware.  Registration paper work completed. Dr. Orpah Melter to assume care per family request.   Please fax discharge summary to [phone]. RN please call report to [phone]. Please arrange transport for patient.    Thank you,   Farrel Gordon, RN, [facility]   [place]  [place] are on AM[facility]

## 2019-11-18 DEATH — deceased

## 2021-08-30 IMAGING — MR MR HEAD W/O CM
10 of 14 series · 36 of 48 positions shown · non-contrast
Comparison: CT head 10/05/2019

CLINICAL DATA: Acute neuro deficit.  Confusion.  History of cancer.

EXAM:
MRI HEAD WITHOUT CONTRAST
TECHNIQUE: Multiplanar, multiecho pulse sequences of the brain and surrounding
structures were obtained without intravenous contrast.

[Series 4: DWI · axial · 3.0mm · 1.09mm/px · z∈[-4,+128]mm · 8 of 92 slices shown (1 of 4)]
[im 1/92]
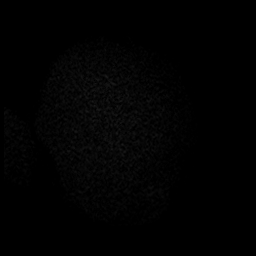
[im 11/92]
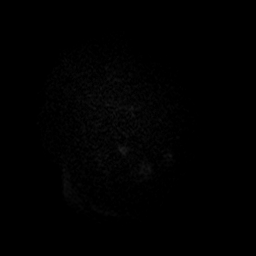
[im 31/92]
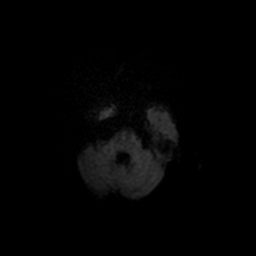
[im 41/92]
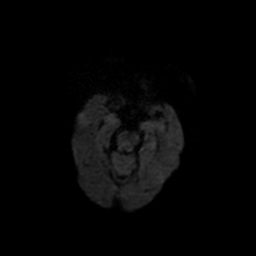
[im 51/92]
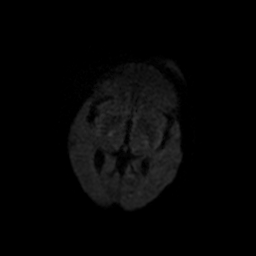
[im 61/92]
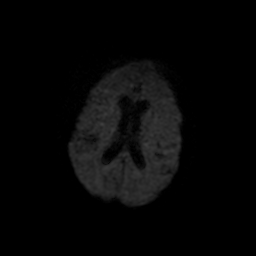
[im 81/92]
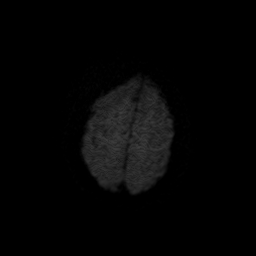
[im 92/92]
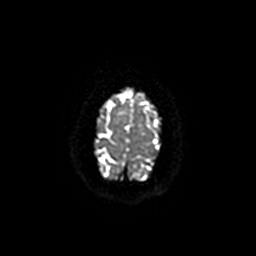

[Series 5: T2 · axial · 5.0mm · 0.43mm/px · z∈[+2,+136]mm · 2 of 24 slices shown]
[im 1/24]
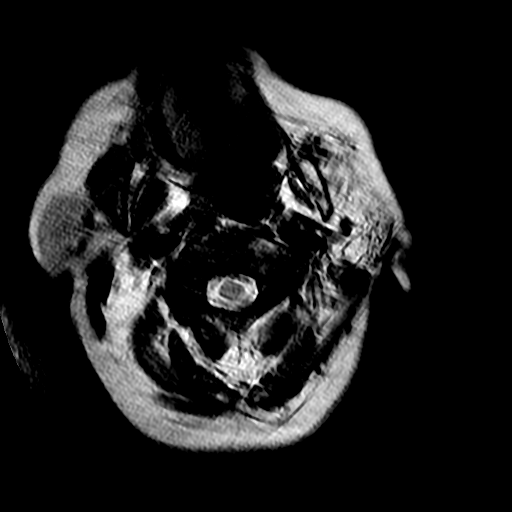
[im 24/24]
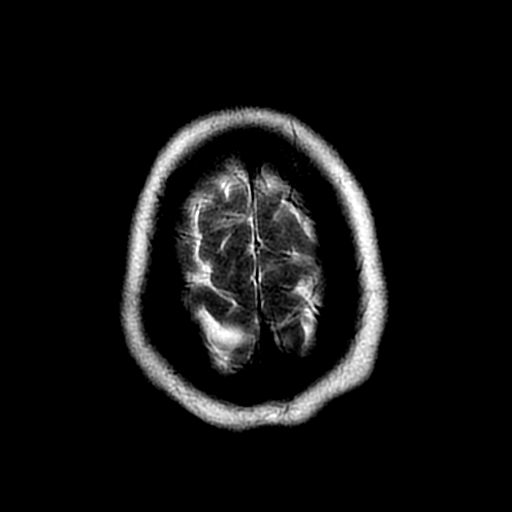

[Series 6: FLAIR · axial · 3.0mm · 0.43mm/px · z∈[+2,+136]mm · 2 of 24 slices shown (1 of 5)]
[im 1/24]
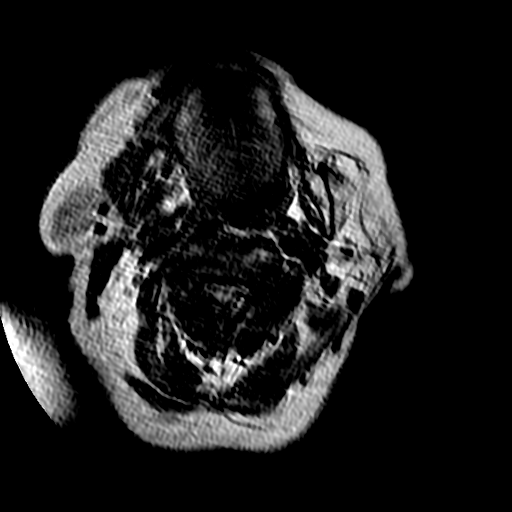
[im 24/24]
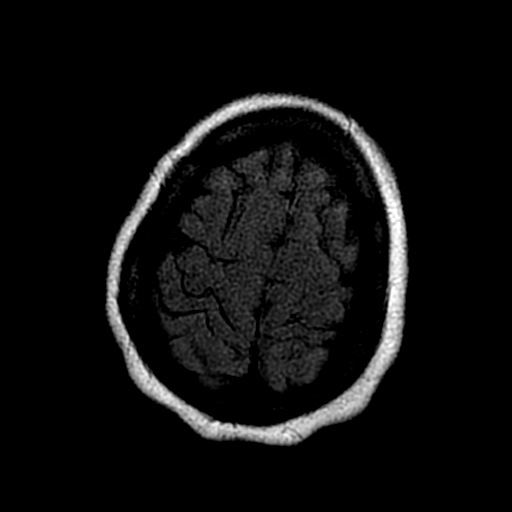

[Series 7: FLAIR · axial · 5.0mm · 0.47mm/px · z∈[+18,+134]mm · 2 of 18 slices shown (2 of 5)]
[im 1/18]
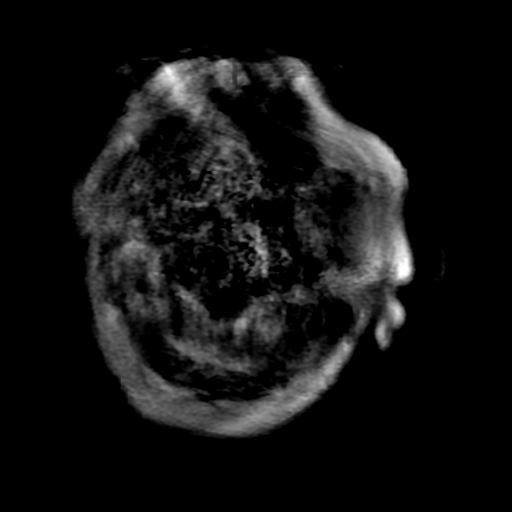
[im 18/18]
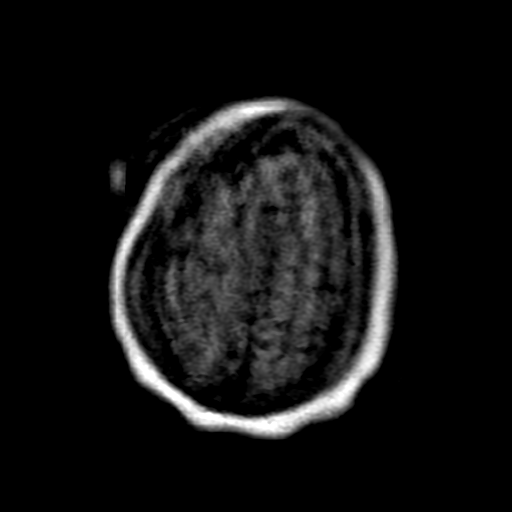

[Series 10: FLAIR · axial · 5.0mm · 0.47mm/px · z∈[+31,+170]mm · 2 of 21 slices shown (3 of 5)]
[im 1/21]
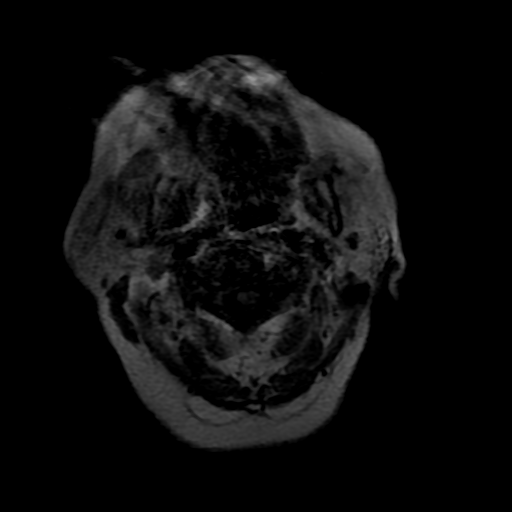
[im 21/21]
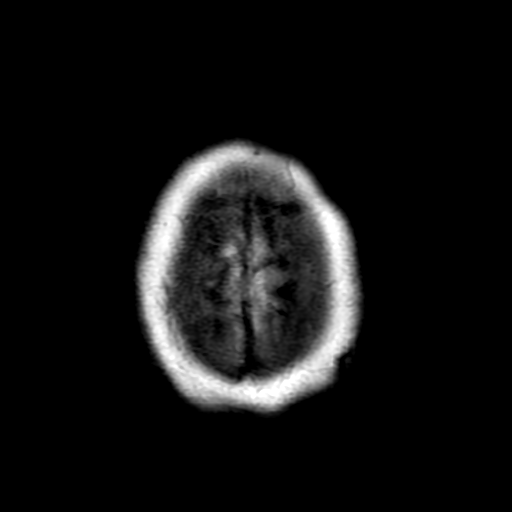

[Series 11: FLAIR · sagittal · 5.0mm · 0.47mm/px · 2 of 19 slices shown (4 of 5)]
[im 1/19]
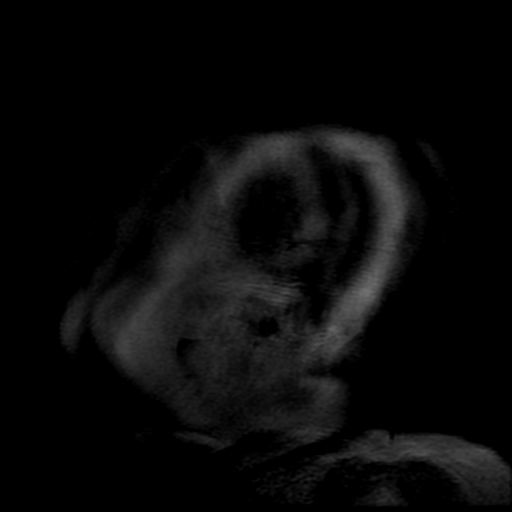
[im 19/19]
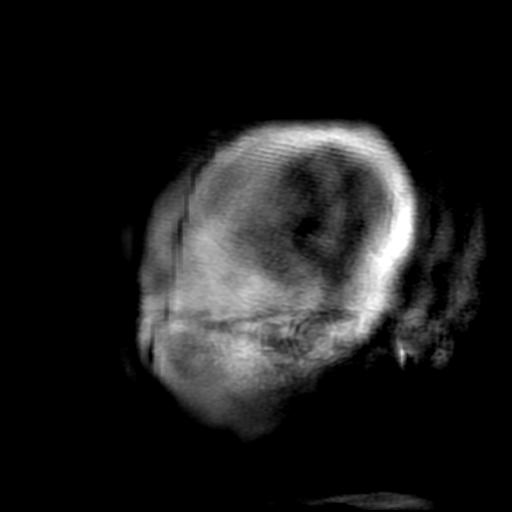

[Series 13: DWI · coronal · 4.0mm · 1.17mm/px · 7 of 70 slices shown (2 of 4)]
[im 1/70]
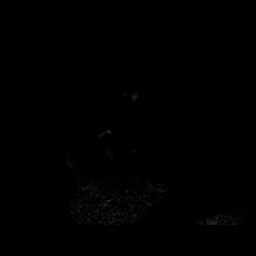
[im 12/70]
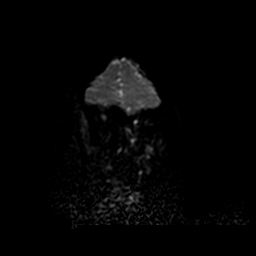
[im 24/70]
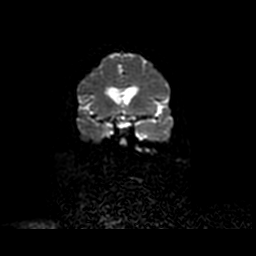
[im 35/70]
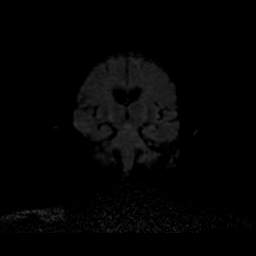
[im 47/70]
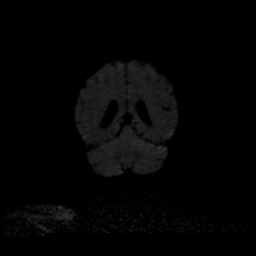
[im 58/70]
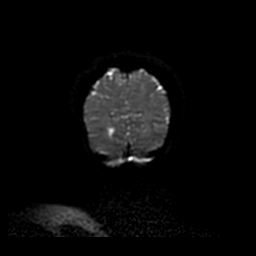
[im 70/70]
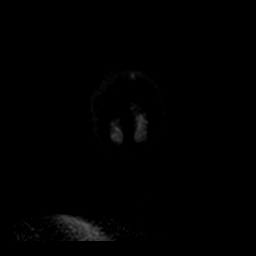

[Series 15: FLAIR · sagittal · 5.0mm · 0.47mm/px · 2 of 19 slices shown (5 of 5)]
[im 1/19]
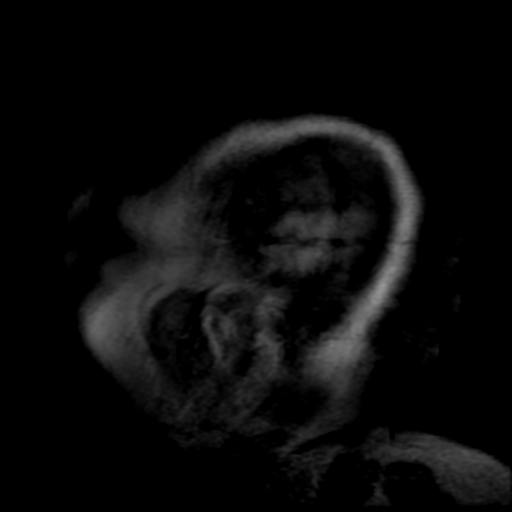
[im 19/19]
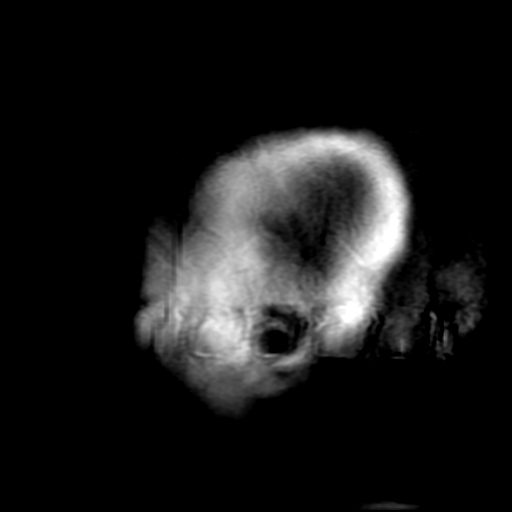

[Series 400: DWI · axial · 3.0mm · 1.09mm/px · z∈[-4,+128]mm · 5 of 46 slices shown (3 of 4)]
[im 1/46]
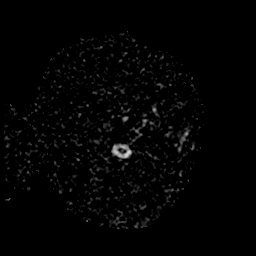
[im 12/46]
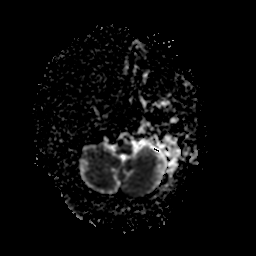
[im 23/46]
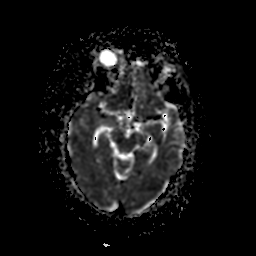
[im 34/46]
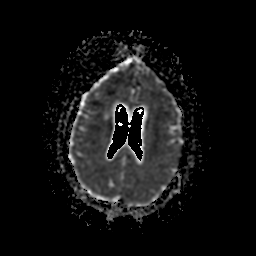
[im 46/46]
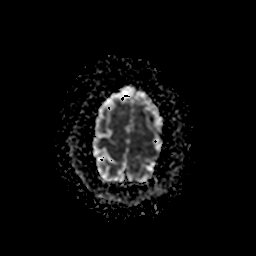

[Series 1300: DWI · coronal · 4.0mm · 1.17mm/px · 4 of 35 slices shown (4 of 4)]
[im 1/35]
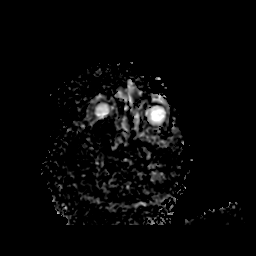
[im 12/35]
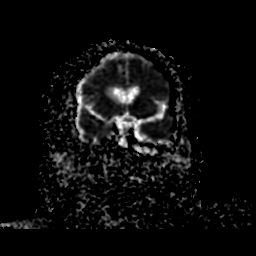
[im 23/35]
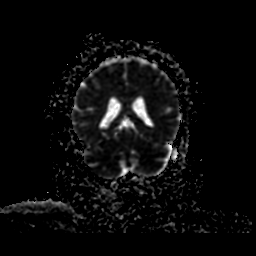
[im 35/35]
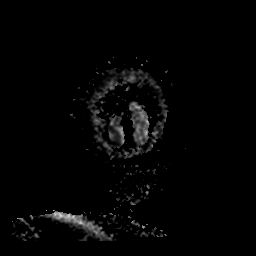

[36 of 48 positions shown; findings below may reference images not displayed]

FINDINGS: Limited study. The patient was moving throughout the study and was
not able to complete most of the study.

Limited imaging demonstrates no acute infarct. Mild atrophy. No mass
lesion. Chronic appearing ischemia in the deep white matter on the
right and in the right thalamus. Probable chronic ischemia in the
pons. No fluid collection or midline shift.

Prior resection of left maxilla. Mucosal edema paranasal sinuses.
Left mastoid effusion.
IMPRESSION: Limited study.  No acute abnormality.
